# Patient Record
Sex: Male | Born: 1940 | Race: Black or African American | Hispanic: No | Smoking: Current every day smoker
Health system: Southern US, Community
[De-identification: ages and names within clinical notes are randomized; demographics above are authoritative.]

## PROBLEM LIST (undated history)

## (undated) DIAGNOSIS — E785 Hyperlipidemia, unspecified: Secondary | ICD-10-CM

## (undated) DIAGNOSIS — E119 Type 2 diabetes mellitus without complications: Secondary | ICD-10-CM

## (undated) DIAGNOSIS — R1310 Dysphagia, unspecified: Secondary | ICD-10-CM

## (undated) DIAGNOSIS — N400 Enlarged prostate without lower urinary tract symptoms: Secondary | ICD-10-CM

## (undated) DIAGNOSIS — I10 Essential (primary) hypertension: Secondary | ICD-10-CM

## (undated) DIAGNOSIS — I639 Cerebral infarction, unspecified: Secondary | ICD-10-CM

---

## 1999-01-08 ENCOUNTER — Encounter: Payer: Self-pay | Admitting: Emergency Medicine

## 1999-01-08 ENCOUNTER — Observation Stay (HOSPITAL_COMMUNITY): Admission: EM | Admit: 1999-01-08 | Discharge: 1999-01-09 | Payer: Self-pay | Admitting: Emergency Medicine

## 1999-01-13 ENCOUNTER — Ambulatory Visit (HOSPITAL_COMMUNITY): Admission: RE | Admit: 1999-01-13 | Discharge: 1999-01-13 | Payer: Self-pay | Admitting: Internal Medicine

## 2017-04-08 ENCOUNTER — Emergency Department (HOSPITAL_COMMUNITY)
Admission: EM | Admit: 2017-04-08 | Discharge: 2017-04-08 | Disposition: A | Discharge status: Home / Self Care | Payer: Medicare Other | Attending: Emergency Medicine | Admitting: Emergency Medicine

## 2017-04-08 ENCOUNTER — Emergency Department (HOSPITAL_COMMUNITY): Payer: Medicare Other

## 2017-04-08 ENCOUNTER — Encounter (HOSPITAL_COMMUNITY): Payer: Self-pay | Admitting: Emergency Medicine

## 2017-04-08 ENCOUNTER — Other Ambulatory Visit: Payer: Self-pay

## 2017-04-08 DIAGNOSIS — R079 Chest pain, unspecified: Secondary | ICD-10-CM | POA: Insufficient documentation

## 2017-04-08 DIAGNOSIS — E119 Type 2 diabetes mellitus without complications: Secondary | ICD-10-CM | POA: Diagnosis not present

## 2017-04-08 DIAGNOSIS — Z79899 Other long term (current) drug therapy: Secondary | ICD-10-CM | POA: Diagnosis not present

## 2017-04-08 DIAGNOSIS — I10 Essential (primary) hypertension: Secondary | ICD-10-CM | POA: Insufficient documentation

## 2017-04-08 HISTORY — DX: Type 2 diabetes mellitus without complications: E11.9

## 2017-04-08 HISTORY — DX: Essential (primary) hypertension: I10

## 2017-04-08 HISTORY — DX: Cerebral infarction, unspecified: I63.9

## 2017-04-08 HISTORY — DX: Benign prostatic hyperplasia without lower urinary tract symptoms: N40.0

## 2017-04-08 HISTORY — DX: Hyperlipidemia, unspecified: E78.5

## 2017-04-08 HISTORY — DX: Dysphagia, unspecified: R13.10

## 2017-04-08 LAB — CBC
HCT: 44.5 % (ref 39.0–52.0)
HEMOGLOBIN: 14.9 g/dL (ref 13.0–17.0)
MCH: 31.2 pg (ref 26.0–34.0)
MCHC: 33.5 g/dL (ref 30.0–36.0)
MCV: 93.3 fL (ref 78.0–100.0)
Platelets: 216 10*3/uL (ref 150–400)
RBC: 4.77 MIL/uL (ref 4.22–5.81)
RDW: 15 % (ref 11.5–15.5)
WBC: 7 10*3/uL (ref 4.0–10.5)

## 2017-04-08 LAB — BASIC METABOLIC PANEL
ANION GAP: 10 (ref 5–15)
BUN: 12 mg/dL (ref 6–20)
CALCIUM: 8.9 mg/dL (ref 8.9–10.3)
CHLORIDE: 105 mmol/L (ref 101–111)
CO2: 21 mmol/L — AB (ref 22–32)
Creatinine, Ser: 1.2 mg/dL (ref 0.61–1.24)
GFR calc non Af Amer: 57 mL/min — ABNORMAL LOW (ref >60)
Glucose, Bld: 120 mg/dL — ABNORMAL HIGH (ref 65–99)
Potassium: 6.1 mmol/L — ABNORMAL HIGH (ref 3.5–5.1)
Sodium: 136 mmol/L (ref 135–145)

## 2017-04-08 LAB — I-STAT TROPONIN, ED: TROPONIN I, POC: 0.01 ng/mL (ref 0.00–0.08)

## 2017-04-08 LAB — POTASSIUM: Potassium: 3.2 mmol/L — ABNORMAL LOW (ref 3.5–5.1)

## 2017-04-08 LAB — TROPONIN I: Troponin I: <0.03 ng/mL (ref <0.03)

## 2017-04-08 NOTE — ED Triage Notes (Signed)
Pt arrive [facility] c/o CP on waking this am, denies SOB, n/v, pt unable to describe CP.  EMS reports giving 324 ASA and 0.4 SL NTG x 2 with relief.  Pt denies CP on arrival, resp e/u on RA at this time. Dr. Charm BargesButler at bedside.

## 2017-04-08 NOTE — ED Notes (Signed)
CALLED [facility] FOR PT TRANSPORT TO [facility]

## 2017-04-08 NOTE — Discharge Instructions (Signed)
You were seen in the emergency department for chest pain.  Your EKG and troponins did not show any sign of any heart damage.  You should continue your regular medicines and return if any recurrence of symptoms.  You should follow-up with your regular doctor.

## 2017-04-08 NOTE — ED Notes (Signed)
Pt ambulated well to the bathroom and back

## 2017-04-08 NOTE — ED Notes (Signed)
[facility], no response.

## 2017-04-08 NOTE — ED Provider Notes (Signed)
[facility] EMERGENCY DEPARTMENT Provider Note   CSN: [id number] Arrival date & time: 04/08/17  1029     History   Chief Complaint No chief complaint on file.   HPI Oscar Salazar is a 77 y.o. male.  The history is provided by the patient and the EMS personnel.  Chest Pain   This is a new problem. The current episode started 3 to 5 hours ago. The problem has been resolved. The pain is present in the substernal region. The pain is at a severity of 8/10. The quality of the pain is described as sharp. The pain does not radiate. Associated symptoms include shortness of breath. Pertinent negatives include no abdominal pain, no back pain, no cough, no diaphoresis, no fever, no nausea and no vomiting.    Level 5 caveat secondary to dementia.  Patient lives in a memory care facility.  He states he woke up at 630 this morning with sharp substernal chest pain.  He denies having this before.  EMS was called and he was given 324 of aspirin and 2 nitro with resolution of his pain.  Patient is limited in his history and just states it hurts and now it does not hurt anymore.  He states he has a little bit of shortness of breath.  He denies nausea vomiting or diarrhea.  Past Medical History:  Diagnosis Date  . Benign prostate hyperplasia   . Diabetes mellitus without complication (HCC)   . Dysphagia   . Hyperlipemia   . Hypertension   . Stroke [facility])     There are no active problems to display for this patient.   History reviewed. No pertinent surgical history.     Home Medications    Prior to Admission medications   Medication Sig Start Date End Date Taking? Authorizing Provider  acyclovir (ZOVIRAX) 400 MG tablet Take 400 mg by mouth 2 (two) times daily. 04/01/17  Yes [provider]  allopurinol (ZYLOPRIM) 100 MG tablet Take 100 mg by mouth daily. 04/01/17  Yes [provider]  atorvastatin (LIPITOR) 40 MG tablet Take 40 mg by mouth at bedtime. 04/01/17   Yes [provider]  clopidogrel (PLAVIX) 75 MG tablet Take 75 mg by mouth daily. 04/01/17  Yes [provider]  ELIQUIS 5 MG TABS tablet Take 5 mg by mouth 2 (two) times daily. 04/01/17  Yes [provider]  furosemide (LASIX) 40 MG tablet Take 40 mg by mouth daily. 04/01/17  Yes [provider]  Insulin Glargine (BASAGLAR KWIKPEN) 100 UNIT/ML SOPN Inject 14 Units into the skin daily.   Yes [provider]  lisinopril (PRINIVIL,ZESTRIL) 2.5 MG tablet Take 2.5 mg by mouth daily. 04/01/17  Yes [provider]  LYRICA 25 MG capsule Take 25 mg by mouth 2 (two) times daily. 04/01/17  Yes [provider]  metFORMIN (GLUCOPHAGE) 500 MG tablet Take 250 mg by mouth daily. 04/02/17  Yes [provider]  metoprolol tartrate (LOPRESSOR) 25 MG tablet Take 25 mg by mouth 2 (two) times daily. 04/01/17  Yes [provider]  Multiple Vitamin (MULTIVITAMIN) capsule Take 1 capsule by mouth daily.   Yes [provider]  omega-3 acid ethyl esters (LOVAZA) 1 g capsule Take 1 g by mouth 2 (two) times daily. 04/05/17  Yes [provider]  PRESCRIPTION MEDICATION Apply 1 application topically 2 (two) times daily. Mineral Cream Apply to Bilateral legs/feet   Yes [provider]  tamsulosin (FLOMAX) 0.4 MG CAPS capsule Take  0.4 mg by mouth daily. 04/01/17  Yes [provider]    Family History No family history on file.  Social History Social History   Tobacco Use  . Smoking status: Not on file  Substance Use Topics  . Alcohol use: Not on file  . Drug use: Not on file     Allergies   Patient has no allergy information on record.   Review of Systems Review of Systems  Unable to perform ROS: Dementia  Constitutional: Negative for diaphoresis and fever.  HENT: Negative for sore throat.   Respiratory: Positive for shortness of breath. Negative for cough.   Cardiovascular: Positive for chest pain.    Gastrointestinal: Negative for abdominal pain, nausea and vomiting.  Genitourinary: Negative for dysuria.  Musculoskeletal: Negative for back pain.  Skin: Negative for rash.     Physical Exam Updated Vital Signs BP 105/74   Pulse (!) 59   Temp 97.9 F (36.6 C) (Oral)   Resp 14   Ht 5\' 9"  (1.753 m)   Wt 108.9 kg (240 lb)   SpO2 100%   BMI 35.44 kg/m   Physical Exam  Constitutional: He appears well-developed and well-nourished.  HENT:  Head: Normocephalic and atraumatic.  Eyes: Conjunctivae are normal.  Neck: Neck supple.  Cardiovascular: Normal rate and regular rhythm.  No murmur heard. Pulmonary/Chest: Effort normal and breath sounds normal. No respiratory distress.  Abdominal: Soft. There is no tenderness.  Musculoskeletal: He exhibits no edema.       Right lower leg: Normal. He exhibits no tenderness and no edema.       Left lower leg: Normal. He exhibits no tenderness and no edema.  Neurological: He is alert.  Skin: Skin is warm and dry.  Psychiatric: He has a normal mood and affect.  Nursing note and vitals reviewed.    ED Treatments / Results  Labs (all labs ordered are listed, but only abnormal results are displayed) Labs Reviewed  BASIC METABOLIC PANEL - Abnormal; Notable for the following components:      Result Value   Potassium 6.1 (*)    CO2 21 (*)    Glucose, Bld 120 (*)    GFR calc non Af Amer 57 (*)    All other components within normal limits  POTASSIUM - Abnormal; Notable for the following components:   Potassium 3.2 (*)    All other components within normal limits  CBC  TROPONIN I  I-STAT TROPONIN, ED    EKG  EKG Interpretation  Date/Time:  Monday April 08 2017 10:38:52 EST Ventricular Rate:  77 PR Interval:    QRS Duration: 94 QT Interval:  409 QTC Calculation: 463 R Axis:   54 Text Interpretation:  Sinus rhythm Multiform ventricular premature complexes Borderline prolonged PR interval Anterolateral infarct, age indeterminate  t wave inversions ant/lat/inferior no prior ecg to compare with.  Confirmed by Meridee ScoreButler, Michael [phone]([id number]) on 04/08/2017 10:43:32 AM       Radiology Dg Chest Port 1 View  Result Date: 04/08/2017 CLINICAL DATA:  77 year old male with chest pain. Initial encounter. EXAM: PORTABLE CHEST 1 VIEW COMPARISON:  None. FINDINGS: Elevated left hemidiaphragm with left base atelectasis, infiltrate and/or pleural effusion. Mild pulmonary vascular prominence. Cardiomegaly. Calcified slightly tortuous aorta. Buckling of the upper trachea may be related to positioning. Cannot exclude substernal extension of goiter. IMPRESSION: Elevated left hemidiaphragm with left base atelectasis, infiltrate and/or pleural effusion. Mild pulmonary vascular prominence. Cardiomegaly. Aortic Atherosclerosis (ICD10-I70.0).  Aorta slightly tortuous. Buckling of the upper  trachea may be related to positioning. Cannot exclude substernal extension of goiter. Patient would eventually benefit from follow-up two-view chest with cardiac leads removed. Electronically Signed   By: Lacy Duverney M.D.   On: 04/08/2017 10:57    Procedures Procedures (including critical care time)  Medications Ordered in ED Medications - No data to display   Initial Impression / Assessment and Plan / ED Course  I have reviewed the triage vital signs and the nursing notes.  Pertinent labs & imaging results that were available during my care of the patient were reviewed by me and considered in my medical decision making (see chart for details).  Clinical Course as of Apr 09 1101  Mon Apr 08, 2017  1529 Repeat potassium is improved, the initial one was likely hemolyzed.  Waiting on second troponin.  [MB]  1540 Reevaluated patient.  No chest pain.  Second troponin is negative.  We will return him to his facility to follow-up with his primary care doctor.  [MB]    Clinical Course User Index [MB] Terrilee Files, MD      Final Clinical Impressions(s) / ED  Diagnoses   Final diagnoses:  Chest pain in adult    ED Discharge Orders    None       Terrilee Files, MD 04/09/17 1103

## 2018-01-24 ENCOUNTER — Emergency Department (HOSPITAL_COMMUNITY): Payer: Medicare Other

## 2018-01-24 ENCOUNTER — Encounter (HOSPITAL_COMMUNITY): Payer: Self-pay | Admitting: *Deleted

## 2018-01-24 ENCOUNTER — Inpatient Hospital Stay (HOSPITAL_COMMUNITY): Payer: Medicare Other

## 2018-01-24 ENCOUNTER — Other Ambulatory Visit: Payer: Self-pay

## 2018-01-24 ENCOUNTER — Inpatient Hospital Stay (HOSPITAL_COMMUNITY)
Admission: EM | Admit: 2018-01-24 | Discharge: 2018-02-19 | DRG: 064 | Disposition: E | Payer: Medicare Other | Attending: Student in an Organized Health Care Education/Training Program | Admitting: Student in an Organized Health Care Education/Training Program

## 2018-01-24 DIAGNOSIS — Z515 Encounter for palliative care: Secondary | ICD-10-CM | POA: Diagnosis not present

## 2018-01-24 DIAGNOSIS — I5023 Acute on chronic systolic (congestive) heart failure: Secondary | ICD-10-CM | POA: Diagnosis present

## 2018-01-24 DIAGNOSIS — R402132 Coma scale, eyes open, to sound, at arrival to emergency department: Secondary | ICD-10-CM | POA: Diagnosis present

## 2018-01-24 DIAGNOSIS — G92 Toxic encephalopathy: Secondary | ICD-10-CM | POA: Diagnosis present

## 2018-01-24 DIAGNOSIS — R740 Nonspecific elevation of levels of transaminase and lactic acid dehydrogenase [LDH]: Secondary | ICD-10-CM | POA: Diagnosis not present

## 2018-01-24 DIAGNOSIS — J449 Chronic obstructive pulmonary disease, unspecified: Secondary | ICD-10-CM | POA: Diagnosis present

## 2018-01-24 DIAGNOSIS — M795 Residual foreign body in soft tissue: Secondary | ICD-10-CM

## 2018-01-24 DIAGNOSIS — R1013 Epigastric pain: Secondary | ICD-10-CM | POA: Diagnosis not present

## 2018-01-24 DIAGNOSIS — I5021 Acute systolic (congestive) heart failure: Secondary | ICD-10-CM | POA: Diagnosis not present

## 2018-01-24 DIAGNOSIS — R29712 NIHSS score 12: Secondary | ICD-10-CM | POA: Diagnosis present

## 2018-01-24 DIAGNOSIS — R4189 Other symptoms and signs involving cognitive functions and awareness: Secondary | ICD-10-CM

## 2018-01-24 DIAGNOSIS — I493 Ventricular premature depolarization: Secondary | ICD-10-CM | POA: Diagnosis not present

## 2018-01-24 DIAGNOSIS — K7200 Acute and subacute hepatic failure without coma: Secondary | ICD-10-CM | POA: Diagnosis not present

## 2018-01-24 DIAGNOSIS — I4891 Unspecified atrial fibrillation: Secondary | ICD-10-CM | POA: Diagnosis present

## 2018-01-24 DIAGNOSIS — R402342 Coma scale, best motor response, flexion withdrawal, at arrival to emergency department: Secondary | ICD-10-CM | POA: Diagnosis present

## 2018-01-24 DIAGNOSIS — Z7902 Long term (current) use of antithrombotics/antiplatelets: Secondary | ICD-10-CM

## 2018-01-24 DIAGNOSIS — E785 Hyperlipidemia, unspecified: Secondary | ICD-10-CM | POA: Diagnosis present

## 2018-01-24 DIAGNOSIS — N189 Chronic kidney disease, unspecified: Secondary | ICD-10-CM | POA: Diagnosis present

## 2018-01-24 DIAGNOSIS — I240 Acute coronary thrombosis not resulting in myocardial infarction: Secondary | ICD-10-CM | POA: Diagnosis present

## 2018-01-24 DIAGNOSIS — F17210 Nicotine dependence, cigarettes, uncomplicated: Secondary | ICD-10-CM | POA: Diagnosis present

## 2018-01-24 DIAGNOSIS — I69351 Hemiplegia and hemiparesis following cerebral infarction affecting right dominant side: Secondary | ICD-10-CM

## 2018-01-24 DIAGNOSIS — I340 Nonrheumatic mitral (valve) insufficiency: Secondary | ICD-10-CM | POA: Diagnosis not present

## 2018-01-24 DIAGNOSIS — F0150 Vascular dementia without behavioral disturbance: Secondary | ICD-10-CM | POA: Diagnosis present

## 2018-01-24 DIAGNOSIS — F0390 Unspecified dementia without behavioral disturbance: Secondary | ICD-10-CM | POA: Diagnosis not present

## 2018-01-24 DIAGNOSIS — N400 Enlarged prostate without lower urinary tract symptoms: Secondary | ICD-10-CM | POA: Diagnosis present

## 2018-01-24 DIAGNOSIS — L8990 Pressure ulcer of unspecified site, unspecified stage: Secondary | ICD-10-CM | POA: Diagnosis present

## 2018-01-24 DIAGNOSIS — R471 Dysarthria and anarthria: Secondary | ICD-10-CM | POA: Diagnosis present

## 2018-01-24 DIAGNOSIS — R7989 Other specified abnormal findings of blood chemistry: Secondary | ICD-10-CM | POA: Diagnosis not present

## 2018-01-24 DIAGNOSIS — R1310 Dysphagia, unspecified: Secondary | ICD-10-CM | POA: Diagnosis present

## 2018-01-24 DIAGNOSIS — R410 Disorientation, unspecified: Secondary | ICD-10-CM | POA: Diagnosis not present

## 2018-01-24 DIAGNOSIS — R569 Unspecified convulsions: Secondary | ICD-10-CM | POA: Diagnosis not present

## 2018-01-24 DIAGNOSIS — L89152 Pressure ulcer of sacral region, stage 2: Secondary | ICD-10-CM | POA: Diagnosis present

## 2018-01-24 DIAGNOSIS — I236 Thrombosis of atrium, auricular appendage, and ventricle as current complications following acute myocardial infarction: Secondary | ICD-10-CM | POA: Diagnosis not present

## 2018-01-24 DIAGNOSIS — I63512 Cerebral infarction due to unspecified occlusion or stenosis of left middle cerebral artery: Principal | ICD-10-CM | POA: Diagnosis present

## 2018-01-24 DIAGNOSIS — E1122 Type 2 diabetes mellitus with diabetic chronic kidney disease: Secondary | ICD-10-CM | POA: Diagnosis present

## 2018-01-24 DIAGNOSIS — I370 Nonrheumatic pulmonary valve stenosis: Secondary | ICD-10-CM | POA: Diagnosis not present

## 2018-01-24 DIAGNOSIS — G9340 Encephalopathy, unspecified: Secondary | ICD-10-CM | POA: Diagnosis present

## 2018-01-24 DIAGNOSIS — N179 Acute kidney failure, unspecified: Secondary | ICD-10-CM | POA: Diagnosis present

## 2018-01-24 DIAGNOSIS — R402222 Coma scale, best verbal response, incomprehensible words, at arrival to emergency department: Secondary | ICD-10-CM | POA: Diagnosis present

## 2018-01-24 DIAGNOSIS — F0280 Dementia in other diseases classified elsewhere without behavioral disturbance: Secondary | ICD-10-CM | POA: Diagnosis not present

## 2018-01-24 DIAGNOSIS — I130 Hypertensive heart and chronic kidney disease with heart failure and stage 1 through stage 4 chronic kidney disease, or unspecified chronic kidney disease: Secondary | ICD-10-CM | POA: Diagnosis present

## 2018-01-24 DIAGNOSIS — Z66 Do not resuscitate: Secondary | ICD-10-CM | POA: Diagnosis not present

## 2018-01-24 DIAGNOSIS — I471 Supraventricular tachycardia: Secondary | ICD-10-CM | POA: Diagnosis not present

## 2018-01-24 DIAGNOSIS — Z9981 Dependence on supplemental oxygen: Secondary | ICD-10-CM | POA: Diagnosis not present

## 2018-01-24 DIAGNOSIS — R570 Cardiogenic shock: Secondary | ICD-10-CM | POA: Diagnosis not present

## 2018-01-24 DIAGNOSIS — N19 Unspecified kidney failure: Secondary | ICD-10-CM | POA: Diagnosis not present

## 2018-01-24 DIAGNOSIS — R2990 Unspecified symptoms and signs involving the nervous system: Secondary | ICD-10-CM

## 2018-01-24 DIAGNOSIS — E872 Acidosis: Secondary | ICD-10-CM | POA: Diagnosis not present

## 2018-01-24 DIAGNOSIS — R5381 Other malaise: Secondary | ICD-10-CM | POA: Diagnosis not present

## 2018-01-24 DIAGNOSIS — R7401 Elevation of levels of liver transaminase levels: Secondary | ICD-10-CM

## 2018-01-24 DIAGNOSIS — R0682 Tachypnea, not elsewhere classified: Secondary | ICD-10-CM | POA: Diagnosis not present

## 2018-01-24 DIAGNOSIS — Z8673 Personal history of transient ischemic attack (TIA), and cerebral infarction without residual deficits: Secondary | ICD-10-CM | POA: Diagnosis not present

## 2018-01-24 DIAGNOSIS — Z7984 Long term (current) use of oral hypoglycemic drugs: Secondary | ICD-10-CM

## 2018-01-24 DIAGNOSIS — K7290 Hepatic failure, unspecified without coma: Secondary | ICD-10-CM | POA: Diagnosis not present

## 2018-01-24 DIAGNOSIS — I69391 Dysphagia following cerebral infarction: Secondary | ICD-10-CM

## 2018-01-24 LAB — URINALYSIS, ROUTINE W REFLEX MICROSCOPIC
BILIRUBIN URINE: NEGATIVE
Glucose, UA: NEGATIVE mg/dL
HGB URINE DIPSTICK: NEGATIVE
Ketones, ur: NEGATIVE mg/dL
Leukocytes, UA: NEGATIVE
Nitrite: NEGATIVE
Protein, ur: NEGATIVE mg/dL
Specific Gravity, Urine: 1.041 — ABNORMAL HIGH (ref 1.005–1.030)
pH: 5 (ref 5.0–8.0)

## 2018-01-24 LAB — I-STAT CHEM 8, ED
BUN: 53 mg/dL — ABNORMAL HIGH (ref 8–23)
CHLORIDE: 108 mmol/L (ref 98–111)
Calcium, Ion: 1 mmol/L — ABNORMAL LOW (ref 1.15–1.40)
Creatinine, Ser: 1.5 mg/dL — ABNORMAL HIGH (ref 0.61–1.24)
Glucose, Bld: 146 mg/dL — ABNORMAL HIGH (ref 70–99)
HCT: 49 % (ref 39.0–52.0)
Hemoglobin: 16.7 g/dL (ref 13.0–17.0)
POTASSIUM: 5.1 mmol/L (ref 3.5–5.1)
Sodium: 139 mmol/L (ref 135–145)
TCO2: 22 mmol/L (ref 22–32)

## 2018-01-24 LAB — DIFFERENTIAL
Abs Immature Granulocytes: 0.04 10*3/uL (ref 0.00–0.07)
Basophils Absolute: 0 10*3/uL (ref 0.0–0.1)
Basophils Relative: 0 %
EOS PCT: 1 %
Eosinophils Absolute: 0.1 10*3/uL (ref 0.0–0.5)
IMMATURE GRANULOCYTES: 1 %
Lymphocytes Relative: 22 %
Lymphs Abs: 1.9 10*3/uL (ref 0.7–4.0)
Monocytes Absolute: 1.3 10*3/uL — ABNORMAL HIGH (ref 0.1–1.0)
Monocytes Relative: 16 %
Neutro Abs: 5.1 10*3/uL (ref 1.7–7.7)
Neutrophils Relative %: 60 %

## 2018-01-24 LAB — CBC
HCT: 43.2 % (ref 39.0–52.0)
Hemoglobin: 14 g/dL (ref 13.0–17.0)
MCH: 30.4 pg (ref 26.0–34.0)
MCHC: 32.4 g/dL (ref 30.0–36.0)
MCV: 93.7 fL (ref 80.0–100.0)
Platelets: 155 10*3/uL (ref 150–400)
RBC: 4.61 MIL/uL (ref 4.22–5.81)
RDW: 17 % — ABNORMAL HIGH (ref 11.5–15.5)
WBC: 8.3 10*3/uL (ref 4.0–10.5)
nRBC: 0.2 % (ref 0.0–0.2)

## 2018-01-24 LAB — COMPREHENSIVE METABOLIC PANEL
ALT: 429 U/L — ABNORMAL HIGH (ref 0–44)
AST: 259 U/L — ABNORMAL HIGH (ref 15–41)
Albumin: 2.8 g/dL — ABNORMAL LOW (ref 3.5–5.0)
Alkaline Phosphatase: 123 U/L (ref 38–126)
Anion gap: 16 — ABNORMAL HIGH (ref 5–15)
BUN: 45 mg/dL — ABNORMAL HIGH (ref 8–23)
CALCIUM: 8.6 mg/dL — AB (ref 8.9–10.3)
CO2: 18 mmol/L — AB (ref 22–32)
Chloride: 103 mmol/L (ref 98–111)
Creatinine, Ser: 1.97 mg/dL — ABNORMAL HIGH (ref 0.61–1.24)
GFR calc Af Amer: 37 mL/min — ABNORMAL LOW (ref >60)
GFR calc non Af Amer: 32 mL/min — ABNORMAL LOW (ref >60)
Glucose, Bld: 152 mg/dL — ABNORMAL HIGH (ref 70–99)
Potassium: 4.7 mmol/L (ref 3.5–5.1)
Sodium: 137 mmol/L (ref 135–145)
Total Bilirubin: 2.2 mg/dL — ABNORMAL HIGH (ref 0.3–1.2)
Total Protein: 5.8 g/dL — ABNORMAL LOW (ref 6.5–8.1)

## 2018-01-24 LAB — I-STAT TROPONIN, ED: Troponin i, poc: 0.01 ng/mL (ref 0.00–0.08)

## 2018-01-24 LAB — POCT I-STAT 3, ART BLOOD GAS (G3+)
Acid-base deficit: 5 mmol/L — ABNORMAL HIGH (ref 0.0–2.0)
Bicarbonate: 16 mmol/L — ABNORMAL LOW (ref 20.0–28.0)
O2 Saturation: 99 %
TCO2: 17 mmol/L — ABNORMAL LOW (ref 22–32)
pCO2 arterial: 20.8 mmHg — ABNORMAL LOW (ref 32.0–48.0)
pH, Arterial: 7.493 — ABNORMAL HIGH (ref 7.350–7.450)
pO2, Arterial: 146 mmHg — ABNORMAL HIGH (ref 83.0–108.0)

## 2018-01-24 LAB — PROTIME-INR
INR: 2.1
Prothrombin Time: 23.3 seconds — ABNORMAL HIGH (ref 11.4–15.2)

## 2018-01-24 LAB — APTT: aPTT: 37 seconds — ABNORMAL HIGH (ref 24–36)

## 2018-01-24 LAB — ACETAMINOPHEN LEVEL: Acetaminophen (Tylenol), Serum: <10 ug/mL — ABNORMAL LOW (ref 10–30)

## 2018-01-24 LAB — ETHANOL

## 2018-01-24 LAB — MRSA PCR SCREENING: MRSA by PCR: NEGATIVE

## 2018-01-24 LAB — CBG MONITORING, ED: Glucose-Capillary: 139 mg/dL — ABNORMAL HIGH (ref 70–99)

## 2018-01-24 LAB — AMMONIA: Ammonia: 25 umol/L (ref 9–35)

## 2018-01-24 MED ORDER — IOPAMIDOL (ISOVUE-370) INJECTION 76%
75.0000 mL | Freq: Once | INTRAVENOUS | Status: AC | PRN
  Administered 2018-01-24: 75 mL via INTRAVENOUS

## 2018-01-24 MED ORDER — DEXTROSE-NACL 5-0.9 % IV SOLN
INTRAVENOUS | Status: DC
  Administered 2018-01-25 – 2018-01-26 (×3): via INTRAVENOUS

## 2018-01-24 MED ORDER — LORAZEPAM 2 MG/ML IJ SOLN
1.0000 mg | Freq: Once | INTRAMUSCULAR | Status: AC
  Administered 2018-01-25: 1 mg via INTRAVENOUS
  Filled 2018-01-24: qty 1

## 2018-01-24 MED ORDER — HEPARIN SODIUM (PORCINE) 5000 UNIT/ML IJ SOLN
5000.0000 [IU] | Freq: Three times a day (TID) | INTRAMUSCULAR | Status: DC
  Administered 2018-01-24 – 2018-01-25 (×5): 5000 [IU] via SUBCUTANEOUS
  Filled 2018-01-24 (×5): qty 1

## 2018-01-24 MED ORDER — THIAMINE HCL 100 MG/ML IJ SOLN
Freq: Once | INTRAVENOUS | Status: AC
  Administered 2018-01-24: 17:00:00 via INTRAVENOUS
  Filled 2018-01-24: qty 1000

## 2018-01-24 MED ORDER — IOPAMIDOL (ISOVUE-M 300) INJECTION 61%: 15.0000 mL | Freq: Once | INTRAMUSCULAR | Status: DC | PRN

## 2018-01-24 MED ORDER — ENOXAPARIN SODIUM 40 MG/0.4ML ~~LOC~~ SOLN: 40.0000 mg | SUBCUTANEOUS | Status: DC

## 2018-01-24 MED ORDER — DEXTROSE-NACL 5-0.9 % IV SOLN: INTRAVENOUS | Status: DC

## 2018-01-24 NOTE — Code Documentation (Signed)
77[facility] at 1110. [facility] [place] where per staff he was LKW at 0730 when he was administered his medications. He was later found on the toilet with head shaking which appeared seizure-like and his eyes were rolling per staff. Staff could not obtain a pulse or O2 saturation, however, his SBP was in the 100s. Staff report he was alert but would not respond. EMS was called and assessed patient to be altered and activated a code stroke. Of note, patient with a h/o stroke with right sided deficits. Also, patient was on Eliquis until 01/02/2018 when it was discontinued by cardiology due to risks outweighing benefits. Stroke RN called nursing facility where staff report patient with 2 week decline in which patient is now wheelchair bound and was started on oxygen 3 days ago. EMR showing patient with a h/o dementia. Stroke team at the bedside on patient arrival. Patient cleared for CT by Dr. Criss AlvineGoldston. Patient to CT with team. CT followed by CTA completed. NIHSS 12, see documentation for details and code stroke times. Patient was lethargic would awaken to verbal stimulation. Patient able to state his name and age and follow simple commands intermittently. Patient with mild right facial asymetry and right sided weakness, unclear of his degree of baseline weakness. No acute stroke treatment at this time per Dr. Wilford CornerArora. Bedside handoff with ED RN Steward DroneBrenda. Of note, patient with PT 23.3, INR 2.10 and aPTT 37 on arrival.

## 2018-01-24 NOTE — ED Notes (Signed)
Pt did not tolerate MRI.  Dr Rogelia BogaButcher paged.

## 2018-01-24 NOTE — Consult Note (Addendum)
Referring Physician: ER    Reason for Consult: Code Stroke  HPI: Oscar Salazar is an 77 y.o. 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 Today he was normal at breakfast and med pass and was then found on the toilet not responding. Staff report his head was shaking and they called EMS for possible seizure. They also noted his right side weaker then normal so EMS called a code stroke. Upon initial evaluation in the ER, he would arouse and follow brief commands intermittently. Dense, chronic looking right side hemiparesis noted. He was having difficulty breathing, but 02 sats remained stable so [facility]. There is multiple areas of encephalomalacia, but no acute looking infarcts. Given report of possible seizure and diagnostic uncertainty and out of the 3 hr window, no tPA given. No LVO seen.   Date last known well: 01/29/2018 Time last known well: 0745  tPA Given: outside of time window and diagnostic uncertanty  Past Medical History Past Medical History:  Diagnosis Date  . Benign prostate hyperplasia   . Diabetes mellitus without complication (HCC)   . Dysphagia   . Hyperlipemia   . Hypertension   . Stroke [facility](HCC)     Surgical History No past surgical history on file.  Family History  No family history on file.  Social History:   reports that he has been smoking cigarettes. He has been smoking about 0.50 packs per day. He does not have any smokeless tobacco history on file. He reports that he does not drink alcohol or use drugs.  Allergies:  No Known Allergies  Home Medications:   (Not in a hospital admission)  Hospital Medications   ROS: Unable  Physical Examination:  Vitals:   02/09/2018 1100 02/17/2018 1203  BP:  (!) 145/108  Pulse:  84  Resp:  (!) 27   SpO2:  94%  Weight: 86.4 kg     General - well nourished, mod distress Heart - Regular rate and rhythm - no murmer appreciated Lungs - Clear to auscultation; dyspnea noted with accessory muscles.  Abdomen - Soft - non tender; distended Extremities - Distal pulses intact - 2+ pitting edema bilat LEs Skin - Warm and dry; skin tears throughout LEs   Neurologic Examination:  Mental Status: Alerts with repeat stimuli. Gives his name, but cannot answer orientation questions. Speech is sparse, some mild aphasia and mild-mod dysarthria noted. Follows commands with repeated stimuli and effort Cranial Nerves: II: Discs not visualized; Visual fields grossly normal, blink to threat, pupils equal, round, reactive to light III,IV, VI: ptosis not present, extra-ocular motions intact bilaterally V,VII: smile slightly asymmetric on left, facial light touch sensation normal bilaterally VIII: hearing normal bilaterally IX,X: gag reflex present XI: absent on right shoulder shrug XII: midline tongue extension Motor: LUE - 5/5    RUE - 0/5   LLE - 5/5    RLE - 2/5 Tone increased in right; atrophy noted Sensory: Light touch intact throughout, bilaterally Deep Tendon Reflexes: mute LEs, 1+ UE Plantars: mute Cerebellar: Unable to fully test given degree of weakness Gait: deferred at this time.   NIHSS 1a Level of Conscious: 1 1b LOC Questions: 2 1c LOC Commands: 0 2 Best Gaze: 0 3 Visual: 0 4 Facial Palsy: 1 5a Motor Arm - left: 0 5b  Motor Arm - Right: 4 6a Motor Leg - Left: 0 6b Motor Leg - Right: 2 7 Limb Ataxia: 0 8 Sensory: 0 9 Best Language: 1 10 Dysarthria: 1 11 Extinct. and Inattention:0 TOTAL: 12   LABORATORY STUDIES:  Basic Metabolic Panel: Recent Labs  Lab 02-11-2018 1116  NA 139  K 5.1  CL 108  GLUCOSE 146*  BUN 53*  CREATININE 1.50*    Liver Function Tests: No results for input(s): AST, ALT, ALKPHOS, BILITOT, PROT, ALBUMIN in the last 168 hours. No results for  input(s): LIPASE, AMYLASE in the last 168 hours. No results for input(s): AMMONIA in the last 168 hours.  CBC: Recent Labs  Lab Feb 11, 2018 1116  HGB 16.7  HCT 49.0    Cardiac Enzymes: No results for input(s): CKTOTAL, CKMB, CKMBINDEX, TROPONINI in the last 168 hours.  BNP: Invalid input(s): POCBNP  CBG: Recent Labs  Lab Feb 11, 2018 1150  GLUCAP 139*    IMAGING reviewed in CT and directly with radiology: Ct Head Code Stroke Wo Contrast  Result Date: 2018-02-11 CLINICAL DATA:  Code stroke. Right-sided weakness. Possible seizure activity. EXAM: CT HEAD WITHOUT CONTRAST TECHNIQUE: Contiguous axial images were obtained from the base of the skull through the vertex without intravenous contrast. COMPARISON:  01/16/2018 FINDINGS: Brain: A large chronic left MCA territory infarct is again seen as well as chronic right larger than left cerebellar infarcts. There's no evidence of acute large territory infarct, intracranial hemorrhage, mass, midline shift, or extra-axial fluid collection. Periventricular white matter hypodensities are unchanged and nonspecific but compatible with mild chronic small vessel ischemic disease. There is ex vacuo dilatation of the left lateral ventricle. Vascular: Calcified atherosclerosis including chronic calcified plaque or embolus in the proximal left MCA, unchanged. Skull: No acute fracture or suspicious osseous lesion. Sinuses/Orbits: Unremarkable orbits. Paranasal sinuses and mastoid air cells are clear. Other: Scattered foci of venous gas including in the cavernous sinuses, likely iatrogenic. ASPECTS [facility] Stroke Program Early CT Score) Not scored in the setting of a large chronic MCA infarct. IMPRESSION: 1. No evidence of acute intracranial abnormality. 2. Large chronic left MCA infarct. 3. Chronic bilateral cerebellar infarcts. These results were called by telephone at the time of interpretation on 02-11-2018 at 11:45 am to [facility], who verbally  acknowledged these results. Electronically Signed   By: Sebastian Ache M.D.   On: 02/11/2018 11:58   Assessment: 77 y.o. male with previous stroke and significant debility at baseline with right side weakness. Today he was found not responding while on toilet. Ddx: new infarct, seizures as he has risk for this given degree of previous strokes seen on CTH. Per report the pt was on Eliquis, but had been taken off by his cardiologist on 11/14. We do not see Afib in history, but given the large LMCA and bilat cerebellar strokes Afib is highest on ddx. No record of active DVT tx. We are unsure why this was stopped.   Stroke Risk Factors - advanced age, prev stroke, HTN, DM, Afib?, HLD  # Acute neurologic change with AMS- EEG and MRI brain to better evaluate. Could have had worsening of old stroke in setting of other acute process or illness. Ddx: toxic-metabolic, seizures, new stroke. *We were unable to get CTA completed d/t severe dyspnea while in scanner. MRA ordered instead.  # h/o stroke with severe debility- poor neurologic reserve could be contributing as well if other illness # Acute dyspnea w/LE edema- Cheyne-Stokes noted which can also be a toxic-metabolic effect. CXR and  echo oreded # HTN- was actually low upon initial eval.   Plan:  MRI, MRA  of the brain/neck   PT consult, OT consult, Speech consult  Echocardiogram  Telemetry monitoring  Frequent neuro checks  Fall Precautions  DVT prophelaxis  NPO until swallowing evaluation has been passed (aspirin suppository if needed)  Will hold off on further stroke wk up pending results on above plan given some diagnostic uncertainty.     Attending Neurologist's note to follow  Attending addendum I have seen and examined the patient personally. I have personally reviewed the imaging-CT head.  Subjective: Patient was brought in as an acute code stroke from the facility. He has a history of prior left MCA stroke with significant  right-sided deficits and to the best of our history taking his modified Rankin score is 4 at baseline. Last known normal-at breakfast time.  Later on was noted to be unresponsive on the toilet. There was some report of staff noticing his head shaking and he called EMS for possible seizure. He was brought in as an acute code stroke because of inability of EMS to arouse him as well as inability of the patient to follow commands. At the emergency room bridge, he was evaluated as an acute code stroke.  He was belly breathing on arrival. His oxygen saturations were within normal range. Taken in for noncontrast CT of the head emergently.  Review of systems was unable to be obtained due to patient's mentation.  Most of the history taking is from staff at the facility that are not very familiar with him.  Objective: General: Drowsy, no distress. CVS: Regular rate rhythm no murmur appreciated.  Hypotensive. Lungs: Clear to auscultation with some dyspnea noted with the use of accessory muscles as well as abdominal breathing. Abdomen: Soft nondistended nontender Extremities: Pitting edema in both lower extremities and warm and dry skin. Neurological exam Patient is very drowsy, opens eyes with repeated stimulation.  Is able to say his name with dysarthric speech but cannot answer orientation questions. Speech is dysarthric Poor attention concentration Does not name, repeat.  Poor comprehension. Cranial nerve examination: Pupils are equal round reactive to light, extraocular movements intact as evidenced by blink to threat, grossly symmetric smile, tongue midline. Motor exam: flaccid RUE-0/5, some movement in the right lower extremity-2/5, left upper and lower extremity are full strength. Sensory exam-light touch intact bilaterally throughout Plantar reflexes are mute.  Difficult to test coordination.  Imaging-reviewed by me personally-: Noncontrast CT of the head-no evidence of acute issues, large  chronic left MCA infarct, chronic bilateral cerebellar infarcts. CTA head and neck was nondiagnostic due to motion artifact and incorrect bolus timing.  Laboratory results Glucose 146, creatinine 1.97, AST 259, ALT 429  Assesment: 77 year old man with multiple comorbidities including stroke with right hemiparesis in the past coming in for acute onset of altered mental status. He was on Eliquis at some point without any history documented of atrial fibrillation but the Eliquis was discontinued on 01/02/2018 according to the paper records.  Current presentation could be secondary to multiple differential diagnoses: -Toxic metabolic encephalopathy given that his liver functions deranged-do not know baseline. - Seizures due to prior stroke - New stroke   Recommendations: MRI, MRA of the head and neck PT OT Frequent neurochecks Echo Telemetry Check ammonia level We will check A1c and LDL only if imaging is positive for stroke.   We will continue to follow.  -- Milon Dikes, MD [facility] Pager: [phone] If 7pm to  7am, please call on call as listed on AMION.

## 2018-01-24 NOTE — Progress Notes (Signed)
Attempted MRI patient uncooperative.

## 2018-01-24 NOTE — ED Notes (Signed)
Placed pt in hospital bed

## 2018-01-24 NOTE — Progress Notes (Addendum)
ELECTROENCEPHALOGRAM REPORT Date of Study: 01/22/2018 MRN: [id number]  Clinical History:    Oscar Salazar is an 77 y.o. 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 Today he was normal at breakfast and med pass and was then found on the toilet not responding. Staff report his head was shaking and they called EMS for possible seizure. They also noted his right side weaker then normal so EMS called a code stroke. Upon initial evaluation in the ER, he would arouse and follow brief commands intermittently. Dense, chronic looking right side hemiparesis noted. He was having difficulty breathing, but 02 sats remained stable so [facility]. There is multiple areas of encephalomalacia, but no acute looking infarcts. Given report of possible seizure and diagnostic uncertainty and out of the 3 hr window, no tPA given. No LVO seen.  Medications:No AED, no sedation  Technical Summary:  A multichannel digital EEG recording measured by the international 10-20 system with electrodes applied with paste and impedances below 5000 ohms performed in our laboratory with EKG monitoring in an awake and asleep patient. Hyperventilation and photic stimulation were not performed. The digital EEG was referentially recorded, reformatted, and digitally filtered in a variety of bipolar and referential montages for optimal display.  Description:  The patient is awake and asleep during the recording. During maximal wakefulness, there is a symmetric, medium voltage 10 Hz posterior dominant rhythm that attenuates with eye opening. The record is symmetric. During drowsiness and sleep for the most part but showed some focal L slowing at times, there is an increase in theta slowing of the background. Vertex waves and symmetric sleep spindles were seen.  There were no electrographic seizures seen.  EKG lead was  unremarkable.  Impression:  This awake and asleep EEG is abnormal due to L focal slowing and muscle artifact. There were no electrographic seizures in this study.

## 2018-01-24 NOTE — ED Provider Notes (Signed)
[facility] EMERGENCY DEPARTMENT Provider Note   CSN: [id number] Arrival date & time: 01/27/2018  1110   An emergency department physician performed an initial assessment on this suspected stroke patient at 1113([place]).  History   Chief Complaint Chief Complaint  Patient presents with  . Code Stroke    HPI Oscar Salazar is a 77 y.o. male. Level 5 caveat due to dementia. HPI Patient brought in by EMS.  Reportedly was found on the toilet.  Reportedly was unresponsive and shaking.  Reportedly was just shaking head however.  May have had difficulty getting pulse but somewhat difficult to get history from nursing home.  Code stroke had been called.  Seen by neurology and myself.  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  No known seizure history.  Had previously been on Eliquis and recently stopped by cardiology.  It appears that there was a history of atrial fibrillation.  Patient cannot really provide much history to me. Past Medical History:  Diagnosis Date  . Benign prostate hyperplasia   . Diabetes mellitus without complication (HCC)   . Dysphagia   . Hyperlipemia   . Hypertension   . Stroke [facility])     There are no active problems to display for this patient.   History reviewed. No pertinent surgical history.      Home Medications    Prior to Admission medications   Medication Sig Start Date End Date Taking? Authorizing Provider  acyclovir (ZOVIRAX) 400 MG tablet Take 400 mg by mouth 2 (two) times daily. 04/01/17   [provider]  allopurinol (ZYLOPRIM) 100 MG tablet Take 100 mg by mouth daily. 04/01/17   [provider]  atorvastatin (LIPITOR) 40 MG tablet Take 40 mg by mouth at bedtime. 04/01/17   [provider]  clopidogrel (PLAVIX) 75 MG tablet Take 75 mg by mouth daily. 04/01/17   [provider]  ELIQUIS 5 MG TABS tablet Take 5 mg by mouth 2 (two) times daily. 04/01/17   [provider]    furosemide (LASIX) 40 MG tablet Take 40 mg by mouth daily. 04/01/17   [provider]  Insulin Glargine (BASAGLAR KWIKPEN) 100 UNIT/ML SOPN Inject 14 Units into the skin daily.    [provider]  lisinopril (PRINIVIL,ZESTRIL) 2.5 MG tablet Take 2.5 mg by mouth daily. 04/01/17   [provider]  LYRICA 25 MG capsule Take 25 mg by mouth 2 (two) times daily. 04/01/17   [provider]  metFORMIN (GLUCOPHAGE) 500 MG tablet Take 250 mg by mouth daily. 04/02/17   [provider]  metoprolol tartrate (LOPRESSOR) 25 MG tablet Take 25 mg by mouth 2 (two) times daily. 04/01/17   [provider]  Multiple Vitamin (MULTIVITAMIN) capsule Take 1 capsule by mouth daily.    [provider]  omega-3 acid ethyl esters (LOVAZA) 1 g capsule Take 1 g by mouth 2 (two) times daily. 04/05/17   [provider]  PRESCRIPTION MEDICATION Apply 1 application topically 2 (two) times daily. Mineral Cream Apply to Bilateral legs/feet    [provider]  tamsulosin (FLOMAX) 0.4 MG CAPS capsule Take 0.4 mg by mouth daily. 04/01/17   [provider]    Family History No family history on file.  Social History Social History   Tobacco Use  . Smoking status: Current Every Day Smoker    Packs/day: 0.50    Types: Cigarettes  Substance Use Topics  . Alcohol use: No    Frequency: Never  .  Drug use: No     Allergies   Patient has no known allergies.   Review of Systems Review of Systems  Unable to perform ROS: Dementia     Physical Exam Updated Vital Signs BP 101/62   Pulse (!) 57   Temp (!) 97.3 F (36.3 C) (Rectal)   Resp 20   Ht 5\' 9"  (1.753 m)   Wt 86.4 kg   SpO2 100%   BMI 28.13 kg/m   Physical Exam  Constitutional: He appears well-developed.  HENT:  Head: Atraumatic.  Eyes:  Sitting in bed with eyes closed.  Does resist me opening them but appear reactive.  Neck: Neck supple.  Cardiovascular: Normal rate.   Pulmonary/Chest: He has no wheezes. He has no rales.  Abdominal: There is no tenderness.  Musculoskeletal: He exhibits no edema.  Neurological:  Sitting in bed with eyes closed.  Will not speak for me.  For nursing however will raise left side.  Appears to be moving left side greater than right which is likely baseline.  Skin: Skin is warm. Capillary refill takes less than 2 seconds.     ED Treatments / Results  Labs (all labs ordered are listed, but only abnormal results are displayed) Labs Reviewed  PROTIME-INR - Abnormal; Notable for the following components:      Result Value   Prothrombin Time 23.3 (*)    All other components within normal limits  APTT - Abnormal; Notable for the following components:   aPTT 37 (*)    All other components within normal limits  CBC - Abnormal; Notable for the following components:   RDW 17.0 (*)    All other components within normal limits  DIFFERENTIAL - Abnormal; Notable for the following components:   Monocytes Absolute 1.3 (*)    All other components within normal limits  COMPREHENSIVE METABOLIC PANEL - Abnormal; Notable for the following components:   CO2 18 (*)    Glucose, Bld 152 (*)    BUN 45 (*)    Creatinine, Ser 1.97 (*)    Calcium 8.6 (*)    Total Protein 5.8 (*)    Albumin 2.8 (*)    AST 259 (*)    ALT 429 (*)    Total Bilirubin 2.2 (*)    GFR calc non Af Amer 32 (*)    GFR calc Af Amer 37 (*)    Anion gap 16 (*)    All other components within normal limits  CBG MONITORING, ED - Abnormal; Notable for the following components:   Glucose-Capillary 139 (*)    All other components within normal limits  I-STAT CHEM 8, ED - Abnormal; Notable for the following components:   BUN 53 (*)    Creatinine, Ser 1.50 (*)    Glucose, Bld 146 (*)    Calcium, Ion 1.00 (*)    All other components within normal limits  URINALYSIS, ROUTINE W REFLEX MICROSCOPIC  AMMONIA  ACETAMINOPHEN LEVEL  ETHANOL  I-STAT TROPONIN, ED     EKG None  Radiology Ct Angio Head W Or Wo Contrast  Result Date: 02/03/2018 CLINICAL DATA:  Right-sided weakness. EXAM: CT ANGIOGRAPHY HEAD AND NECK TECHNIQUE: Multidetector CT imaging of the head and neck was performed using the standard protocol during bolus administration of intravenous contrast. Multiplanar CT image reconstructions and MIPs were obtained to evaluate the vascular anatomy. Carotid stenosis measurements (when applicable) are obtained utilizing NASCET criteria, using the distal internal carotid diameter as the denominator. CONTRAST:  75mL ISOVUE-370  IOPAMIDOL (ISOVUE-370) INJECTION 76% COMPARISON:  Carotid Doppler ultrasound 05/28/2012 FINDINGS: CTA NECK FINDINGS Due to respiratory motion there was incorrect bolus timing with only venous contrast present including refluxed contrast in the right internal jugular vein and multiple right upper chest venous collaterals. No arterial contrast is present in the head or neck and the study is nondiagnostic for assessment of the arterial structures. Skeleton: Severe diffuse cervical disc degeneration with moderate multilevel neural foraminal stenosis. Other neck: Breathing motion artifact without evidence of acute abnormality or mass. Scattered venous gas, likely iatrogenic. Upper chest: Aortic atherosclerosis. Mild centrilobular emphysema. No apical lung consolidation. Review of the MIP images confirms the above findings CTA HEAD FINDINGS Nondiagnostic evaluation of the intracranial vasculature. Calcified atherosclerosis of the intracranial vertebral and internal carotid arteries with chronic calcified plaque or calcified embolus in the left M1 segment. Review of the MIP images confirms the above findings IMPRESSION: 1. Nondiagnostic study due to motion artifact resulting in incorrect bolus timing. 2. Aortic Atherosclerosis (ICD10-I70.0) and Emphysema (ICD10-J43.9). These results were called by telephone at the time of interpretation on 02/03/2018  at 11:45 pm to Dr. Danise Edge, who verbally acknowledged these results. Electronically Signed   By: Sebastian Ache M.D.   On: 02/16/2018 12:08   Ct Angio Neck W Or Wo Contrast  Result Date: 02/17/2018 CLINICAL DATA:  Right-sided weakness. EXAM: CT ANGIOGRAPHY HEAD AND NECK TECHNIQUE: Multidetector CT imaging of the head and neck was performed using the standard protocol during bolus administration of intravenous contrast. Multiplanar CT image reconstructions and MIPs were obtained to evaluate the vascular anatomy. Carotid stenosis measurements (when applicable) are obtained utilizing NASCET criteria, using the distal internal carotid diameter as the denominator. CONTRAST:  75mL ISOVUE-370 IOPAMIDOL (ISOVUE-370) INJECTION 76% COMPARISON:  Carotid Doppler ultrasound 05/28/2012 FINDINGS: CTA NECK FINDINGS Due to respiratory motion there was incorrect bolus timing with only venous contrast present including refluxed contrast in the right internal jugular vein and multiple right upper chest venous collaterals. No arterial contrast is present in the head or neck and the study is nondiagnostic for assessment of the arterial structures. Skeleton: Severe diffuse cervical disc degeneration with moderate multilevel neural foraminal stenosis. Other neck: Breathing motion artifact without evidence of acute abnormality or mass. Scattered venous gas, likely iatrogenic. Upper chest: Aortic atherosclerosis. Mild centrilobular emphysema. No apical lung consolidation. Review of the MIP images confirms the above findings CTA HEAD FINDINGS Nondiagnostic evaluation of the intracranial vasculature. Calcified atherosclerosis of the intracranial vertebral and internal carotid arteries with chronic calcified plaque or calcified embolus in the left M1 segment. Review of the MIP images confirms the above findings IMPRESSION: 1. Nondiagnostic study due to motion artifact resulting in incorrect bolus timing. 2. Aortic  Atherosclerosis (ICD10-I70.0) and Emphysema (ICD10-J43.9). These results were called by telephone at the time of interpretation on 02/06/2018 at 11:45 pm to Dr. Danise Edge, who verbally acknowledged these results. Electronically Signed   By: Sebastian Ache M.D.   On: 01/31/2018 12:08   Dg Chest Portable 1 View  Result Date: 02/01/2018 CLINICAL DATA:  Acute presentation with altered mental status. EXAM: PORTABLE CHEST 1 VIEW COMPARISON:  12/30/2017 FINDINGS: Chronic cardiomegaly. Venous hypertension without edema. No effusions. No infiltrate, collapse or effusion. Aortic atherosclerosis. No acute bone finding. IMPRESSION: Chronic cardiomegaly and pulmonary venous hypertension. Aortic atherosclerosis. Electronically Signed   By: Paulina Fusi M.D.   On: 02/08/2018 13:19   Ct Head Code Stroke Wo Contrast  Result Date: 01/19/2018 CLINICAL DATA:  Code stroke. Right-sided weakness. Possible  seizure activity. EXAM: CT HEAD WITHOUT CONTRAST TECHNIQUE: Contiguous axial images were obtained from the base of the skull through the vertex without intravenous contrast. COMPARISON:  01/16/2018 FINDINGS: Brain: A large chronic left MCA territory infarct is again seen as well as chronic right larger than left cerebellar infarcts. There's no evidence of acute large territory infarct, intracranial hemorrhage, mass, midline shift, or extra-axial fluid collection. Periventricular white matter hypodensities are unchanged and nonspecific but compatible with mild chronic small vessel ischemic disease. There is ex vacuo dilatation of the left lateral ventricle. Vascular: Calcified atherosclerosis including chronic calcified plaque or embolus in the proximal left MCA, unchanged. Skull: No acute fracture or suspicious osseous lesion. Sinuses/Orbits: Unremarkable orbits. Paranasal sinuses and mastoid air cells are clear. Other: Scattered foci of venous gas including in the cavernous sinuses, likely iatrogenic. ASPECTS [facility]([place]  Stroke Program Early CT Score) Not scored in the setting of a large chronic MCA infarct. IMPRESSION: 1. No evidence of acute intracranial abnormality. 2. Large chronic left MCA infarct. 3. Chronic bilateral cerebellar infarcts. These results were called by telephone at the time of interpretation on 02/16/2018 at 11:45 am to [facility], who verbally acknowledged these results. Electronically Signed   By: Sebastian AcheAllen  Grady M.D.   On: 03-29-17 11:58    Procedures Procedures (including critical care time)  Medications Ordered in ED Medications  iopamidol (ISOVUE-M) 61 % intrathecal injection 15 mL (has no administration in time range)  iopamidol (ISOVUE-370) 76 % injection 75 mL (75 mLs Intravenous Contrast Given 01/22/2018 1146)     Initial Impression / Assessment and Plan / ED Course  I have reviewed the triage vital signs and the nursing notes.  Pertinent labs & imaging results that were available during my care of the patient were reviewed by me and considered in my medical decision making (see chart for details).     Patient with ams. Baseline dementia.  Initially seen as a code stroke but thought to be less likely ischemic.  LFTs were elevated.  Worsening renal function.  X-ray reassuring.  Will admit to unassigned internal medicine for further work-up.  Final Clinical Impressions(s) / ED Diagnoses   Final diagnoses:  Acute encephalopathy  AKI (acute kidney injury) (HCC)  Transaminitis    ED Discharge Orders    None       Benjiman CorePickering, Azell Bill, MD 2017-04-27 1437

## 2018-01-24 NOTE — Progress Notes (Signed)
EEG completed, results pending. 

## 2018-01-24 NOTE — H&P (Signed)
Date: 02/14/2018               Patient Name:  Oscar Salazar MRN: [id number]  DOB: 10/09/1940 Age / Sex: 77 y.o., male   PCP: Shelbie AmmonsHaque, Imran P, MD         Medical Service: Internal Medicine Teaching Service         Attending Physician: Dr. Burns SpainButcher, Elizabeth A, MD    First Contact: Dr. Julian HyJamie [name] MD Pager: [phone]  Second Contact: Dr. 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  Oscar Sal[facility].  History is given by nursing facility staff.  They state that between 3-4 months ago he was able to walk around without assistance.  He was having full conversations with staff and was overall doing well.  He then abruptly began to decline physically and was not able to walk as far stating that he would stop breathing and collapse.  He began working with physical therapy but continued to decline.  In the past 3 to 4 weeks he has only been able to walk about 30 feet before becoming short of breath and collapsing.  The nurse practitioner at the facility did extensive labs that were all unremarkable.  [facility] [place] 1 week ago and was "declining in diagnosis" and did not seem to have an heart issue.  He was then referred to a pulmonologist which he was supposed to see in 1 to 2 weeks.  This morning he was communicating well at around 8 AM.  He then went to the bathroom where he was found to have jerking movements and labored breathing.  They were unable to get an oxygen reading and subsequently hold  EMS.  Meds:  Current Meds  Medication Sig  . acyclovir (ZOVIRAX) 400 MG tablet Take 400 mg by mouth 2 (two) times daily.  Marland Kitchen. allopurinol (ZYLOPRIM) 100 MG tablet Take 100 mg by mouth daily.  Marland Kitchen. atorvastatin (LIPITOR) 40 MG tablet Take 40 mg by mouth at bedtime.  . clopidogrel (PLAVIX) 75 MG tablet Take 75 mg by mouth daily.  . furosemide (LASIX) 20 MG tablet Take 20 mg by mouth daily.   Marland Kitchen. loratadine (CLARITIN) 10 MG tablet Take 10 mg by mouth daily.  . metFORMIN (GLUCOPHAGE) 500 MG tablet Take 250 mg by mouth 2 (two) times daily with a meal.   . metoprolol tartrate (LOPRESSOR) 25 MG tablet Take 25 mg by mouth 2 (two) times daily.  . Multiple Vitamin (MULTIVITAMIN WITH MINERALS) TABS tablet Take 1 tablet by mouth daily.  . OXYGEN Inhale 2 L into the lungs continuous.  . pregabalin (LYRICA) 25 MG capsule Take 25 mg by mouth 2 (two) times daily.  . sertraline (ZOLOFT) 25 MG tablet Take 25 mg by mouth daily. For anxiety  . tamsulosin (FLOMAX) 0.4 MG CAPS capsule Take 0.4 mg by mouth daily.     Allergies: Allergies as of 2017/06/28  . (No Known Allergies)   Past Medical History:  Diagnosis Date  . Benign prostate hyperplasia   . Diabetes mellitus without  complication (HCC)   . Dysphagia   . Hyperlipemia   . Hypertension   . Stroke [facility])     Family History: Unable to elicit family history due to patient's mental status.  Social History: He lives at [facility] skilled nursing facility.  Review of Systems: A complete ROS was negative except as per HPI.   Physical Exam: Blood pressure 101/62, pulse (!) 57, temperature (!) 97.3 F (36.3 C), temperature source Rectal, resp. rate 20, height 5\' 9"  (1.753 m), weight 86.4 kg, SpO2 100 %. Physical Exam  Constitutional: He appears well-developed and well-nourished.  Eyes: Pupils are equal, round, and reactive to light. Conjunctivae and EOM are normal.  Neck: Normal range of motion.  Cardiovascular: Normal rate, regular rhythm and  normal heart sounds.  Pulmonary/Chest:  Wheezes noted in all lung fields. He has very shallow abdominal breathing for 20-30 seconds and will then start to heavily breath for several seconds.  Abdominal: Soft. Bowel sounds are normal. He exhibits no distension. There is no tenderness.  Neurological: He is alert.  He is somnolent and awakes intermittently to answer questions with few word answers.  Skin:  Small stage II pressure ulcers at the coccyx area and perineal area. Another small stage II pressure ulcer at his left shin area of LE.  Nursing note and vitals reviewed.   EKG: personally reviewed my interpretation is sinus rhythm, normal heart rate with prolonged QT  CXR: personally reviewed my interpretation is cardiomegaly without effusions or infiltrates.  CT head: No evidence of acute intracranial abnormality.  Large chronic left MCA infarct.  Chronic bilateral cerebellar infarcts.  CT angios head and neck: Calcified atherosclerosis of the intracranial vertebral and internal carotid arteries with chronic calcified plaque in the left M1 segment.  Unable to do CTA neck due to incorrect bolus timing.  EEG: Impression: This awake and asleep EEG is abnormal due to L focal slowing and muscle artifact. There were no electrographic seizures in this study.  Labs significant for: Elevated PT 23.3, INR 2.10, PTT at 37 Unremarkable CBC Elevated transaminases AST/ALT: 259/429 Elevated T bili at 2.2 Weighted creatinine at 1.97 (1.50 10 months ago) Troponins negative  Assessment & Plan by Problem: Active Problems:   Encephalopathy  Oscar Salazar presents with encephalopathy of unclear etiology.  MRI does not show signs of an acute ischemic event.  LFTs were elevated and he does seem to have worsening renal function.  Differential includes hypercapnia, worsening dementia, ingestion, and metabolic abnormalities.  Encephalopathy of unclear etiology - Continue supplemental oxygen as needed -  D5 half normal saline with thiamine and folic acid maintenance fluids - Follow-up ABG. - Follow-up urinalysis - Follow-up acetaminophen and ethanol levels - Follow-up ammonia - Follow-up echo  Transaminitis - Ordered complete abdominal ultrasound - Follow-up acute hepatitis panel  Acute kidney injury: BUN/Cre=22.84 suggest prerenal causes such as dehydration.  Will order fluids and repeat CMP in the morning. - Continue fluids per above  CODE STATUS-full code DVT PPX: Heparin subcu FEN: NPO  Dispo: Admit patient to Inpatient with expected length of stay greater than 2 midnights.  Signed: Synetta Shadow, MD 02/09/2018, 3:12 PM  Pager: [phone]

## 2018-01-24 NOTE — ED Notes (Signed)
Patient transported to MRI 

## 2018-01-25 ENCOUNTER — Inpatient Hospital Stay (HOSPITAL_COMMUNITY): Payer: Medicare Other

## 2018-01-25 DIAGNOSIS — I370 Nonrheumatic pulmonary valve stenosis: Secondary | ICD-10-CM

## 2018-01-25 DIAGNOSIS — G9340 Encephalopathy, unspecified: Secondary | ICD-10-CM

## 2018-01-25 DIAGNOSIS — R740 Nonspecific elevation of levels of transaminase and lactic acid dehydrogenase [LDH]: Secondary | ICD-10-CM

## 2018-01-25 DIAGNOSIS — R1013 Epigastric pain: Secondary | ICD-10-CM

## 2018-01-25 DIAGNOSIS — F0390 Unspecified dementia without behavioral disturbance: Secondary | ICD-10-CM

## 2018-01-25 DIAGNOSIS — R7989 Other specified abnormal findings of blood chemistry: Secondary | ICD-10-CM

## 2018-01-25 DIAGNOSIS — F0150 Vascular dementia without behavioral disturbance: Secondary | ICD-10-CM

## 2018-01-25 DIAGNOSIS — Z9981 Dependence on supplemental oxygen: Secondary | ICD-10-CM

## 2018-01-25 DIAGNOSIS — Z8673 Personal history of transient ischemic attack (TIA), and cerebral infarction without residual deficits: Secondary | ICD-10-CM

## 2018-01-25 DIAGNOSIS — R5381 Other malaise: Secondary | ICD-10-CM

## 2018-01-25 DIAGNOSIS — I340 Nonrheumatic mitral (valve) insufficiency: Secondary | ICD-10-CM

## 2018-01-25 LAB — COMPREHENSIVE METABOLIC PANEL
ALBUMIN: 3.1 g/dL — AB (ref 3.5–5.0)
ALT: 496 U/L — AB (ref 0–44)
AST: 356 U/L — ABNORMAL HIGH (ref 15–41)
Alkaline Phosphatase: 134 U/L — ABNORMAL HIGH (ref 38–126)
Anion gap: 15 (ref 5–15)
BUN: 50 mg/dL — AB (ref 8–23)
CO2: 19 mmol/L — ABNORMAL LOW (ref 22–32)
CREATININE: 1.82 mg/dL — AB (ref 0.61–1.24)
Calcium: 8.8 mg/dL — ABNORMAL LOW (ref 8.9–10.3)
Chloride: 107 mmol/L (ref 98–111)
GFR calc Af Amer: 41 mL/min — ABNORMAL LOW (ref >60)
GFR calc non Af Amer: 35 mL/min — ABNORMAL LOW (ref >60)
Glucose, Bld: 145 mg/dL — ABNORMAL HIGH (ref 70–99)
Potassium: 4.8 mmol/L (ref 3.5–5.1)
Sodium: 141 mmol/L (ref 135–145)
Total Bilirubin: 2.7 mg/dL — ABNORMAL HIGH (ref 0.3–1.2)
Total Protein: 6.1 g/dL — ABNORMAL LOW (ref 6.5–8.1)

## 2018-01-25 LAB — CBC
HCT: 42.5 % (ref 39.0–52.0)
Hemoglobin: 13.9 g/dL (ref 13.0–17.0)
MCH: 29.5 pg (ref 26.0–34.0)
MCHC: 32.7 g/dL (ref 30.0–36.0)
MCV: 90.2 fL (ref 80.0–100.0)
Platelets: 150 10*3/uL (ref 150–400)
RBC: 4.71 MIL/uL (ref 4.22–5.81)
RDW: 16.8 % — ABNORMAL HIGH (ref 11.5–15.5)
WBC: 8.1 10*3/uL (ref 4.0–10.5)
nRBC: 0.4 % — ABNORMAL HIGH (ref 0.0–0.2)

## 2018-01-25 LAB — HEPATITIS PANEL, ACUTE
HCV Ab: <0.1 s/co ratio (ref 0.0–0.9)
Hep A IgM: NEGATIVE
Hep B C IgM: NEGATIVE
Hepatitis B Surface Ag: NEGATIVE

## 2018-01-25 LAB — ECHOCARDIOGRAM COMPLETE
Height: 69 in
Weight: 3047.64 oz

## 2018-01-25 MED ORDER — ORAL CARE MOUTH RINSE
15.0000 mL | Freq: Two times a day (BID) | OROMUCOSAL | Status: DC
  Administered 2018-01-25: 15 mL via OROMUCOSAL

## 2018-01-25 MED ORDER — DIPHENHYDRAMINE HCL 50 MG/ML IJ SOLN
25.0000 mg | Freq: Once | INTRAMUSCULAR | Status: AC
  Administered 2018-01-25: 25 mg via INTRAVENOUS
  Filled 2018-01-25: qty 1

## 2018-01-25 NOTE — Progress Notes (Signed)
  Echocardiogram 2D Echocardiogram has been performed.  Celene SkeenVijay  Larwence Tu 01/25/2018, 2:55 PM

## 2018-01-25 NOTE — Progress Notes (Addendum)
Neurology Progress Note:  Interval History: Pt admitted from [facility] as a code stroke. MRI neg for new stroke. 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 He has been restless and agitated overnight. LFTs are very elevated and he continues to have dyspnea. Primary team working up hepatic encephalopathy. Neuro exam remains unchanged at this time  ROS: unable d/t agitation/confusion  Allergies No Known Allergies  Home Medications Medications Prior to Admission  Medication Sig Dispense Refill  . acyclovir (ZOVIRAX) 400 MG tablet Take 400 mg by mouth 2 (two) times daily.    Marland Kitchen allopurinol (ZYLOPRIM) 100 MG tablet Take 100 mg by mouth daily.    Marland Kitchen atorvastatin (LIPITOR) 40 MG tablet Take 40 mg by mouth at bedtime.    . clopidogrel (PLAVIX) 75 MG tablet Take 75 mg by mouth daily.    . furosemide (LASIX) 20 MG tablet Take 20 mg by mouth daily.     Marland Kitchen loratadine (CLARITIN) 10 MG tablet Take 10 mg by mouth daily.    . metFORMIN (GLUCOPHAGE) 500 MG tablet Take 250 mg by mouth 2 (two) times daily with a meal.     . metoprolol tartrate (LOPRESSOR) 25 MG tablet Take 25 mg by mouth 2 (two) times daily.    . Multiple Vitamin (MULTIVITAMIN WITH MINERALS) TABS tablet Take 1 tablet by mouth daily.    . OXYGEN Inhale 2 L into the lungs continuous.    . pregabalin (LYRICA) 25 MG capsule Take 25 mg by mouth 2 (two) times daily.    . sertraline (ZOLOFT) 25 MG tablet Take 25 mg by mouth daily. For anxiety    . tamsulosin (FLOMAX) 0.4 MG CAPS capsule Take 0.4 mg by mouth daily.      Hospital Medications . heparin  5,000 Units Subcutaneous Q8H     Objective  Intake/Output from previous day: No intake/output data recorded. Intake/Output this shift: No intake/output data recorded. Nutritional status:  Diet Order            Diet NPO time specified  Diet effective now               Physical Exam -  Vitals:   01/25/2018 1845 02/14/2018 2100 01/25/18 0000 01/25/18 0429  BP:  101/73 100/75 114/78 114/79  Pulse: 91 87 90 97  Resp: (!) 23 (!) 25 (!) 25 (!) 33  Temp: 97.7 F (36.5 C) (!) 97.4 F (36.3 C) (!) 97.4 F (36.3 C) 98.3 F (36.8 C)  TempSrc: Oral Oral Oral Oral  SpO2: 96% 99% 93% 100%  Weight:      Height:       General - moderate distress Heart - Regular rate and rhythm - no murmer Lungs - Clear to auscultation; dyspnea Abdomen - Soft, but distended Extremities - Distal pulses intact - 1-2+LE edema Skin - Warm and dry  Neurologic Exam:   Mental Status: Alerts with repeat stimuli. Gives his name, but cannot answer orientation questions. Speech is sparse, some mild aphasia and mild-mod dysarthria noted. Follows commands with repeated stimuli and effort Cranial Nerves: II: Discs not visualized; Visual fields grossly normal, blink to threat, pupils equal, round, reactive to light III,IV, VI: ptosis not present, extra-ocular motions intact bilaterally V,VII: smile slightly asymmetric on left, facial light touch sensation normal bilaterally VIII: hearing normal bilaterally IX,X: gag reflex present XI: absent on right shoulder shrug XII: midline tongue extension Motor: LUE - 5/5  RUE - 0/5   LLE - 5/5                                             RLE - 2/5 Tone increased in right; atrophy noted Sensory: Light touch intact throughout, bilaterally Deep Tendon Reflexes: mute LEs, 1+ UE Plantars: mute Cerebellar: Unable to fully test given degree of weakness Gait: unable at baseline, uses w/c     LABORATORY RESULTS:  Basic Metabolic Panel: Recent Labs  Lab 01/26/2018 1116 02/09/2018 1149 01/25/18 0709  NA 139 137 141  K 5.1 4.7 4.8  CL 108 103 107  CO2  --  18* 19*  GLUCOSE 146* 152* 145*  BUN 53* 45* 50*  CREATININE 1.50* 1.97* 1.82*  CALCIUM  --  8.6* 8.8*    Liver Function Tests: Recent Labs  Lab 01/25/2018 1149 01/25/18 0709  AST 259* 356*  ALT 429* 496*  ALKPHOS 123 134*  BILITOT  2.2* 2.7*  PROT 5.8* 6.1*  ALBUMIN 2.8* 3.1*   No results for input(s): LIPASE, AMYLASE in the last 168 hours. Recent Labs  Lab 02/13/2018 1703  AMMONIA 25    CBC: Recent Labs  Lab 01/26/2018 1116 01/27/2018 1149 01/25/18 0709  WBC  --  8.3 8.1  NEUTROABS  --  5.1  --   HGB 16.7 14.0 13.9  HCT 49.0 43.2 42.5  MCV  --  93.7 90.2  PLT  --  155 150    Cardiac Enzymes: No results for input(s): CKTOTAL, CKMB, CKMBINDEX, TROPONINI in the last 168 hours.  Lipid Panel: No results for input(s): CHOL, TRIG, HDL, CHOLHDL, VLDL, LDLCALC in the last 168 hours.  CBG: Recent Labs  Lab 01/22/2018 1150  GLUCAP 139*    Microbiology:   Coagulation Studies: Recent Labs    02/05/2018 1149  LABPROT 23.3*  INR 2.10    Miscellaneous Labs:   IMAGING RESULTS Reviewed: Ct Angio Head W Or Wo Contrast  Result Date: 01/23/2018 CLINICAL DATA:  Right-sided weakness. EXAM: CT ANGIOGRAPHY HEAD AND NECK TECHNIQUE: Multidetector CT imaging of the head and neck was performed using the standard protocol during bolus administration of intravenous contrast. Multiplanar CT image reconstructions and MIPs were obtained to evaluate the vascular anatomy. Carotid stenosis measurements (when applicable) are obtained utilizing NASCET criteria, using the distal internal carotid diameter as the denominator. CONTRAST:  75mL ISOVUE-370 IOPAMIDOL (ISOVUE-370) INJECTION 76% COMPARISON:  Carotid Doppler ultrasound 05/28/2012 FINDINGS: CTA NECK FINDINGS Due to respiratory motion there was incorrect bolus timing with only venous contrast present including refluxed contrast in the right internal jugular vein and multiple right upper chest venous collaterals. No arterial contrast is present in the head or neck and the study is nondiagnostic for assessment of the arterial structures. Skeleton: Severe diffuse cervical disc degeneration with moderate multilevel neural foraminal stenosis. Other neck: Breathing motion artifact without  evidence of acute abnormality or mass. Scattered venous gas, likely iatrogenic. Upper chest: Aortic atherosclerosis. Mild centrilobular emphysema. No apical lung consolidation. Review of the MIP images confirms the above findings CTA HEAD FINDINGS Nondiagnostic evaluation of the intracranial vasculature. Calcified atherosclerosis of the intracranial vertebral and internal carotid arteries with chronic calcified plaque or calcified embolus in the left M1 segment. Review of the MIP images confirms the above findings IMPRESSION: 1. Nondiagnostic study due to motion artifact resulting in incorrect bolus timing. 2. Aortic Atherosclerosis (ICD10-I70.0) and  Emphysema (ICD10-J43.9). These results were called by telephone at the time of interpretation on 02/15/2018 at 11:45 pm to Dr. Danise Edge, who verbally acknowledged these results. Electronically Signed   By: Sebastian Ache M.D.   On: 02/18/2018 12:08   Dg Abd 1 View  Result Date: 01/25/2018 CLINICAL DATA:  77 year old male. Evaluate for foreign object prior to MRI scan. EXAM: ABDOMEN - 1 VIEW COMPARISON:  None. FINDINGS: A transverse fixation screws through the bilateral SI joints and sacrum as well as additional left SI joint fixation screw. No other metallic or radiopaque foreign object identified. There is no bowel dilatation or evidence of obstruction. Excreted contrast noted in the renal collecting system and within the bladder. There is degenerative changes of the spine. Partially visualized left pleural effusion. IMPRESSION: Two fixation screws in the sacrum and SI joints. No additional radiopaque foreign object. Electronically Signed   By: Elgie Collard M.D.   On: 01/25/2018 02:48   Ct Angio Neck W Or Wo Contrast  Result Date: 01/20/2018 CLINICAL DATA:  Right-sided weakness. EXAM: CT ANGIOGRAPHY HEAD AND NECK TECHNIQUE: Multidetector CT imaging of the head and neck was performed using the standard protocol during bolus administration of  intravenous contrast. Multiplanar CT image reconstructions and MIPs were obtained to evaluate the vascular anatomy. Carotid stenosis measurements (when applicable) are obtained utilizing NASCET criteria, using the distal internal carotid diameter as the denominator. CONTRAST:  75mL ISOVUE-370 IOPAMIDOL (ISOVUE-370) INJECTION 76% COMPARISON:  Carotid Doppler ultrasound 05/28/2012 FINDINGS: CTA NECK FINDINGS Due to respiratory motion there was incorrect bolus timing with only venous contrast present including refluxed contrast in the right internal jugular vein and multiple right upper chest venous collaterals. No arterial contrast is present in the head or neck and the study is nondiagnostic for assessment of the arterial structures. Skeleton: Severe diffuse cervical disc degeneration with moderate multilevel neural foraminal stenosis. Other neck: Breathing motion artifact without evidence of acute abnormality or mass. Scattered venous gas, likely iatrogenic. Upper chest: Aortic atherosclerosis. Mild centrilobular emphysema. No apical lung consolidation. Review of the MIP images confirms the above findings CTA HEAD FINDINGS Nondiagnostic evaluation of the intracranial vasculature. Calcified atherosclerosis of the intracranial vertebral and internal carotid arteries with chronic calcified plaque or calcified embolus in the left M1 segment. Review of the MIP images confirms the above findings IMPRESSION: 1. Nondiagnostic study due to motion artifact resulting in incorrect bolus timing. 2. Aortic Atherosclerosis (ICD10-I70.0) and Emphysema (ICD10-J43.9). These results were called by telephone at the time of interpretation on 01/20/2018 at 11:45 pm to Dr. Danise Edge, who verbally acknowledged these results. Electronically Signed   By: Sebastian Ache M.D.   On: 02/06/2018 12:08   Mr Maxine Glenn Head Wo Contrast  Result Date: 01/25/2018 CLINICAL DATA:  Initial evaluation for acute encephalopathy, stroke. EXAM: MRI  HEAD WITHOUT CONTRAST MRA HEAD WITHOUT CONTRAST MRA NECK WITHOUT CONTRAST TECHNIQUE: Multiplanar, multiecho pulse sequences of the brain and surrounding structures were obtained without intravenous contrast. Angiographic images of the Circle of Willis were obtained using MRA technique without intravenous contrast. Angiographic images of the neck were obtained using MRA technique without intravenous contrast. Carotid stenosis measurements (when applicable) are obtained utilizing NASCET criteria, using the distal internal carotid diameter as the denominator. COMPARISON:  Prior CTs from 01/25/2018 FINDINGS: MRI HEAD FINDINGS Brain: Examination severely limited due to extensive motion artifact. Advanced parenchymal volume loss with mild to moderate chronic microvascular ischemic disease. Large chronic left MCA territory infarct with associated encephalomalacia and gliosis. Additional chronic bilateral  cerebellar infarcts, right greater than left. No definite foci of restricted diffusion to suggest acute or subacute ischemia. Gray-white matter differentiation otherwise maintained. No definite foci of susceptibility artifact to suggest acute or chronic intracranial hemorrhage. No appreciable mass lesion on this motion degraded exam. No midline shift or mass effect. No hydrocephalus. No appreciable extra-axial fluid collection. Pituitary gland grossly normal. Vascular: Abnormal flow void within the left ICA to approximately the level of the terminus, suggesting slow flow and/or occlusion. Major intracranial vascular flow voids otherwise grossly maintained at the skull base. Skull and upper cervical spine: Craniocervical junction within normal limits. No focal marrow replacing lesion. Scalp soft tissues unremarkable. Sinuses/Orbits: Globes and orbital soft tissues grossly within normal limits. Scattered mucosal thickening throughout the lobe paranasal sinuses. No air-fluid levels identified. Mastoid air cells clear. Other:  None. MRA HEAD FINDINGS ANTERIOR CIRCULATION: Examination severely limited by extensive motion artifact. Absent flow related signal within the left ICA to the terminus, consistent with occlusion. Right ICA grossly patent to the terminus. Evaluation for stenosis limited due to extensive motion. A1 segments patent bilaterally. Hypoplastic left A1. Grossly patent and normal anterior communicating artery. Anterior cerebral arteries grossly patent to their mid aspects. M1 segments patent bilaterally without occlusion or appreciable stenosis. Normal MCA bifurcations. Right MCA branches perfused. Left MCA branches perfused as well though appear attenuated as compared to the right, likely related to chronic left MCA territory infarct. POSTERIOR CIRCULATION: Vertebral arteries grossly patent at the skull base. Posterior inferior cerebral arteries patent proximally. Basilar grossly patent to its distal aspect. No appreciable basilar stenosis on this motion degraded exam. Superior cerebral arteries patent proximally. Both of the PCAs primarily supplied via the basilar, although small bilateral posterior communicating arteries noted. PCAs grossly patent to their distal aspects. Possible short-segment severe right P2 stenosis noted (series 1052, image 15). MRA NECK FINDINGS Examination is essentially nondiagnostic due to extensive motion artifact. Right common and internal carotid arteries grossly patent. Some flow related signal seen within the left CCA, although the left ICA not well seen and may be occluded. Partially visualized vertebral arteries patent with antegrade flow. Right vertebral artery appears dominant. Examination inadequate to evaluate for stenoses. IMPRESSION: MRI HEAD IMPRESSION: 1. Severely limited study due to extensive motion artifact. 2. No definite acute intracranial infarct or other abnormality identified. 3. 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  4. Chronic left ICA occlusion. MRA HEAD IMPRESSION: 1. Severely limited exam due to extensive motion artifact. 2. Chronic left ICA occlusion. Distal reconstitution of the left MCA and ACA via flow across the circle-of-Willis. 3. Remainder of the major arterial vasculature of the head grossly patent without obvious abnormality. MRA NECK IMPRESSION: 1. Essentially nondiagnostic MRA of the neck due to severe motion artifact. 2. Grossly patent right carotid artery system and vertebral arteries. Partially visualized left CCA patent. Left ICA not visualized, and may be occluded within the neck. Electronically Signed   By: Rise Mu M.D.   On: 01/25/2018 05:59   Mr Maxine Glenn Neck Wo Contrast  Result Date: 01/25/2018 CLINICAL DATA:  Initial evaluation for acute encephalopathy, stroke. EXAM: MRI HEAD WITHOUT CONTRAST MRA HEAD WITHOUT CONTRAST MRA NECK WITHOUT CONTRAST TECHNIQUE: Multiplanar, multiecho pulse sequences of the brain and surrounding structures were obtained without intravenous contrast. Angiographic images of the Circle of Willis were obtained using MRA technique without intravenous contrast. Angiographic images of the neck were obtained using MRA technique without intravenous contrast. Carotid stenosis measurements (when applicable) are obtained  utilizing NASCET criteria, using the distal internal carotid diameter as the denominator. COMPARISON:  Prior CTs from January 28, 2018 FINDINGS: MRI HEAD FINDINGS Brain: Examination severely limited due to extensive motion artifact. Advanced parenchymal volume loss with mild to moderate chronic microvascular ischemic disease. Large chronic left MCA territory infarct with associated encephalomalacia and gliosis. Additional chronic bilateral cerebellar infarcts, right greater than left. No definite foci of restricted diffusion to suggest acute or subacute ischemia. Gray-white matter differentiation otherwise maintained. No definite foci of susceptibility artifact to  suggest acute or chronic intracranial hemorrhage. No appreciable mass lesion on this motion degraded exam. No midline shift or mass effect. No hydrocephalus. No appreciable extra-axial fluid collection. Pituitary gland grossly normal. Vascular: Abnormal flow void within the left ICA to approximately the level of the terminus, suggesting slow flow and/or occlusion. Major intracranial vascular flow voids otherwise grossly maintained at the skull base. Skull and upper cervical spine: Craniocervical junction within normal limits. No focal marrow replacing lesion. Scalp soft tissues unremarkable. Sinuses/Orbits: Globes and orbital soft tissues grossly within normal limits. Scattered mucosal thickening throughout the lobe paranasal sinuses. No air-fluid levels identified. Mastoid air cells clear. Other: None. MRA HEAD FINDINGS ANTERIOR CIRCULATION: Examination severely limited by extensive motion artifact. Absent flow related signal within the left ICA to the terminus, consistent with occlusion. Right ICA grossly patent to the terminus. Evaluation for stenosis limited due to extensive motion. A1 segments patent bilaterally. Hypoplastic left A1. Grossly patent and normal anterior communicating artery. Anterior cerebral arteries grossly patent to their mid aspects. M1 segments patent bilaterally without occlusion or appreciable stenosis. Normal MCA bifurcations. Right MCA branches perfused. Left MCA branches perfused as well though appear attenuated as compared to the right, likely related to chronic left MCA territory infarct. POSTERIOR CIRCULATION: Vertebral arteries grossly patent at the skull base. Posterior inferior cerebral arteries patent proximally. Basilar grossly patent to its distal aspect. No appreciable basilar stenosis on this motion degraded exam. Superior cerebral arteries patent proximally. Both of the PCAs primarily supplied via the basilar, although small bilateral posterior communicating arteries noted.  PCAs grossly patent to their distal aspects. Possible short-segment severe right P2 stenosis noted (series 1052, image 15). MRA NECK FINDINGS Examination is essentially nondiagnostic due to extensive motion artifact. Right common and internal carotid arteries grossly patent. Some flow related signal seen within the left CCA, although the left ICA not well seen and may be occluded. Partially visualized vertebral arteries patent with antegrade flow. Right vertebral artery appears dominant. Examination inadequate to evaluate for stenoses. IMPRESSION: MRI HEAD IMPRESSION: 1. Severely limited study due to extensive motion artifact. 2. No definite acute intracranial infarct or other abnormality identified. 3. 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 MRA HEAD IMPRESSION: 1. Severely limited exam due to extensive motion artifact. 2. Chronic left ICA occlusion. Distal reconstitution of the left MCA and ACA via flow across the circle-of-Willis. 3. Remainder of the major arterial vasculature of the head grossly patent without obvious abnormality. MRA NECK IMPRESSION: 1. Essentially nondiagnostic MRA of the neck due to severe motion artifact. 2. Grossly patent right carotid artery system and vertebral arteries. Partially visualized left CCA patent. Left ICA not visualized, and may be occluded within the neck. Electronically Signed   By: Rise Mu M.D.   On: 01/25/2018 05:59   Mr Brain Wo Contrast  Result Date: 01/25/2018 CLINICAL DATA:  Initial evaluation for acute encephalopathy, stroke. EXAM: MRI HEAD WITHOUT CONTRAST MRA HEAD WITHOUT  CONTRAST MRA NECK WITHOUT CONTRAST TECHNIQUE: Multiplanar, multiecho pulse sequences of the brain and surrounding structures were obtained without intravenous contrast. Angiographic images of the Circle of Willis were obtained using MRA technique without intravenous contrast. Angiographic images  of the neck were obtained using MRA technique without intravenous contrast. Carotid stenosis measurements (when applicable) are obtained utilizing NASCET criteria, using the distal internal carotid diameter as the denominator. COMPARISON:  Prior CTs from 02/18/2018 FINDINGS: MRI HEAD FINDINGS Brain: Examination severely limited due to extensive motion artifact. Advanced parenchymal volume loss with mild to moderate chronic microvascular ischemic disease. Large chronic left MCA territory infarct with associated encephalomalacia and gliosis. Additional chronic bilateral cerebellar infarcts, right greater than left. No definite foci of restricted diffusion to suggest acute or subacute ischemia. Gray-white matter differentiation otherwise maintained. No definite foci of susceptibility artifact to suggest acute or chronic intracranial hemorrhage. No appreciable mass lesion on this motion degraded exam. No midline shift or mass effect. No hydrocephalus. No appreciable extra-axial fluid collection. Pituitary gland grossly normal. Vascular: Abnormal flow void within the left ICA to approximately the level of the terminus, suggesting slow flow and/or occlusion. Major intracranial vascular flow voids otherwise grossly maintained at the skull base. Skull and upper cervical spine: Craniocervical junction within normal limits. No focal marrow replacing lesion. Scalp soft tissues unremarkable. Sinuses/Orbits: Globes and orbital soft tissues grossly within normal limits. Scattered mucosal thickening throughout the lobe paranasal sinuses. No air-fluid levels identified. Mastoid air cells clear. Other: None. MRA HEAD FINDINGS ANTERIOR CIRCULATION: Examination severely limited by extensive motion artifact. Absent flow related signal within the left ICA to the terminus, consistent with occlusion. Right ICA grossly patent to the terminus. Evaluation for stenosis limited due to extensive motion. A1 segments patent bilaterally.  Hypoplastic left A1. Grossly patent and normal anterior communicating artery. Anterior cerebral arteries grossly patent to their mid aspects. M1 segments patent bilaterally without occlusion or appreciable stenosis. Normal MCA bifurcations. Right MCA branches perfused. Left MCA branches perfused as well though appear attenuated as compared to the right, likely related to chronic left MCA territory infarct. POSTERIOR CIRCULATION: Vertebral arteries grossly patent at the skull base. Posterior inferior cerebral arteries patent proximally. Basilar grossly patent to its distal aspect. No appreciable basilar stenosis on this motion degraded exam. Superior cerebral arteries patent proximally. Both of the PCAs primarily supplied via the basilar, although small bilateral posterior communicating arteries noted. PCAs grossly patent to their distal aspects. Possible short-segment severe right P2 stenosis noted (series 1052, image 15). MRA NECK FINDINGS Examination is essentially nondiagnostic due to extensive motion artifact. Right common and internal carotid arteries grossly patent. Some flow related signal seen within the left CCA, although the left ICA not well seen and may be occluded. Partially visualized vertebral arteries patent with antegrade flow. Right vertebral artery appears dominant. Examination inadequate to evaluate for stenoses. IMPRESSION: MRI HEAD IMPRESSION: 1. Severely limited study due to extensive motion artifact. 2. No definite acute intracranial infarct or other abnormality identified. 3. 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 MRA HEAD IMPRESSION: 1. Severely limited exam due to extensive motion artifact. 2. Chronic left ICA occlusion. Distal reconstitution of the left MCA and ACA via flow across the circle-of-Willis. 3. Remainder of the major arterial vasculature of the head grossly patent without obvious  abnormality. MRA NECK IMPRESSION: 1. Essentially nondiagnostic MRA of the neck due to severe motion artifact. 2. Grossly patent right carotid artery system and vertebral arteries.  Partially visualized left CCA patent. Left ICA not visualized, and may be occluded within the neck. Electronically Signed   By: Rise MuBenjamin  McClintock M.D.   On: 01/25/2018 05:59   Dg Chest Portable 1 View  Result Date: 01/23/2018 CLINICAL DATA:  Acute presentation with altered mental status. EXAM: PORTABLE CHEST 1 VIEW COMPARISON:  12/30/2017 FINDINGS: Chronic cardiomegaly. Venous hypertension without edema. No effusions. No infiltrate, collapse or effusion. Aortic atherosclerosis. No acute bone finding. IMPRESSION: Chronic cardiomegaly and pulmonary venous hypertension. Aortic atherosclerosis. Electronically Signed   By: Paulina FusiMark  Shogry M.D.   On: 07/16/17 13:19   Dg Hip Unilat With Pelvis 1v Right  Result Date: 01/25/2018 CLINICAL DATA:  R/o foreign body for mri EXAM: DG HIP (WITH OR WITHOUT PELVIS) 1V RIGHT COMPARISON:  None. FINDINGS: Cannulated screw spans the sacrum. RIGHT hip is located. Remote fracture of the inferior pubic ramus IMPRESSION: No acute fracture dislocation. Cannulated screw spans the SI joints Electronically Signed   By: Genevive BiStewart  Edmunds M.D.   On: 01/25/2018 02:50   Ct Head Code Stroke Wo Contrast  Result Date: 02/16/2018 CLINICAL DATA:  Code stroke. Right-sided weakness. Possible seizure activity. EXAM: CT HEAD WITHOUT CONTRAST TECHNIQUE: Contiguous axial images were obtained from the base of the skull through the vertex without intravenous contrast. COMPARISON:  01/16/2018 FINDINGS: Brain: A large chronic left MCA territory infarct is again seen as well as chronic right larger than left cerebellar infarcts. There's no evidence of acute large territory infarct, intracranial hemorrhage, mass, midline shift, or extra-axial fluid collection. Periventricular white matter hypodensities are unchanged and  nonspecific but compatible with mild chronic small vessel ischemic disease. There is ex vacuo dilatation of the left lateral ventricle. Vascular: Calcified atherosclerosis including chronic calcified plaque or embolus in the proximal left MCA, unchanged. Skull: No acute fracture or suspicious osseous lesion. Sinuses/Orbits: Unremarkable orbits. Paranasal sinuses and mastoid air cells are clear. Other: Scattered foci of venous gas including in the cavernous sinuses, likely iatrogenic. ASPECTS [facility]([place] Stroke Program Early CT Score) Not scored in the setting of a large chronic MCA infarct. IMPRESSION: 1. No evidence of acute intracranial abnormality. 2. Large chronic left MCA infarct. 3. Chronic bilateral cerebellar infarcts. These results were called by telephone at the time of interpretation on 02/12/2018 at 11:45 am to [facility] View[facility]iree Metzger-Chelka, who verbally acknowledged these results. Electronically Signed   By: Sebastian AcheAllen  Grady M.D.   On: 07/16/17 11:58   EEG: This awake and asleep EEG is abnormal due to L focal slowing and muscle artifact. There were no electrographic seizures in this study.  Assessment/Plan:  Pt admitted from SNF as a code stroke; AMS. MRI neg for new stroke. 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 EEG neg for seizures. He has been restless and agitated overnight. LFTs are very elevated and he continues to have dyspnea. Primary team working up hepatic encephalopathy.   # Toxic metabolic encephalopathy- EEG did not show seizures of status. MRI did not show new structural issues/no new stroke. Extensive previous strokes would make for prolonged encephalopathy and poor neurologic reserve for a pt with hepatic disease.LFTs are quite elevated, baseline unknown. NH4 is 25. AKI also note which can be contributing factor. 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 Will need rehab efforts as  tol. High risk for delirium  # Dyspnea- work up per primary team. ddx hypercapnia, which could also cause encephalopathy.   Recommendations: Supportive care Hepatic work  up per primary team The brain lags behind any corrections to the metabolic dysfunction, would expect a very slow recovery in mentation, especially given the degree of underlying stroke damage. He has a poor neurologic reserve.  We will follow up

## 2018-01-25 NOTE — Progress Notes (Signed)
   Subjective: [name] continues to have altered mental status this morning.  He awakens with verbal stimulation but does not respond to questioning.  Did not have any acute events overnight.  Objective:  Vital signs in last 24 hours: Vitals:   01/25/18 0000 01/25/18 0429 01/25/18 1000 01/25/18 1142  BP: 114/78 114/79 95/67 (!) 89/71  Pulse: 90 97 95 95  Resp: (!) 25 (!) 33 (!) 28 10  Temp: (!) 97.4 F (36.3 C) 98.3 F (36.8 C) (!) 97.5 F (36.4 C) (!) 94.5 F (34.7 C)  TempSrc: Oral Oral Axillary Oral  SpO2: 93% 100% 96% 98%  Weight:      Height:       Physical Exam  Constitutional:  Sleep lying in bed in no acute distress.  Pulmonary/Chest:  No wheezes auscultated.  Normal breath sounds.  He does seem to have continued shallow breathing with intermittent increase in respirations.  He is oxygenating well in the room.  Neurological:  He continues to have altered mental status this morning.  He awakens to verbal stimuli but does not respond to questioning.    Assessment/Plan:  Active Problems:   Encephalopathy  [name] is a history of dementia and stroke who presented with encephalopathy.  We have ruled out ACS (negative troponins and unremarkable EKG) and infection (unremarkable chest x-ray and urinalysis). Ingestion unlikely given that he is given his regular medications by SNF.  CVA has not been completely ruled out by report MRI with constant motion.  Given that he is Eliquis was stopped several weeks ago CVA is still in our differential.  Will order a head CT to reevaluate for stroke (he cannot tolerate MRI and if an acute ischemic event occurred it is now over 24 hours and should show up on CT). He may also have had a new seizure secondary to his old strokes.  Additionally he was found to have elevated LFTs with abdominal ultrasound concerning for cholecystitis.  Will order HIDA scan today.  Acute encephalopathy: Continues to have altered mental status this  morning.  Patient neurology recommendations. - Follow-up head CT - We will consider antiepileptics pending CT results - Follow-up echo  Transaminitis: AST/ALT continue to be elevated. Complete abdominal ultrasound showed normal-appearing gallbladder with wall thickening.  Will order HIDA scan to evaluate for cholecystitis.  Hepatitis panel negative. - Follow-up HIDA scan  Acute kidney injury: BUN/Cre=22.84 suggest prerenal causes such as dehydration.  Will order fluids and repeat CMP in the morning. - Continue D5 half-normal saline with thiamine and folic acid.  CODE STATUS-full code DVT PPX: Heparin subcu FEN: NPO  Dispo: Anticipated discharge in 3-4 days.  Oscar Salazar, Oscar Salazar M, MD 01/25/2018, 11:44 AM Pager: [phone]

## 2018-01-25 NOTE — Progress Notes (Signed)
NM Hepato W/EjeCT Fract not completed due to agitation. MD made aware

## 2018-01-25 NOTE — Plan of Care (Signed)

## 2018-01-25 NOTE — Progress Notes (Signed)
Date: 01/25/2018  Patient name: Oscar Salazar  Medical record number: [id number]  Date of birth: Oct 29, 1940   I have seen and evaluated Oscar Salazar and discussed their care with the Residency Team.  Oscar Salazar is a 77 year old resident of [facility] facility.  The admitting team was unable to get a history from the patient due to encephalopathy and chronic dementia.  He has had a 3 to 39-monthslow decline in his physical abilities.  Previously, he was able to walk independently and was having full conversations.  Recently he was only able to walk a short distances and would stop it due to breathing difficulties and collapse.  Physical therapy did not improve his physical stamina.  In the past 3 or 4 weeks, he has only been able to walk about 30 feet before becoming dyspneic and collapsing.  Reportedly he was started on oxygen about 3 weeks ago.  He had an outpatient cardiology work-up without a reversible etiology being found and has been scheduled to see pulmonology as an outpatient.  On the morning of admission, he was found to have jerking movements and labored breathing and EMS was called.  An O2 sat was not able to be obtained and his SBP was in the 100s.  His work-up has included a CT of the head which showed no acute abnormalities.  He also had an MRI of the brain which was severely limited due to extensive movement that did not show any acute infarcts.  An EEG ruled out status epilepticus.  Lab results showed an elevated AST and ALT levels along with total bili and alk phos.  Of note, his Eliquis was stopped about 3 weeks ago when he saw cardiology.  They felt the risks exceeded the benefit.  I cannot find any reason why he was on Eliquis after reviewing the available notes and the EHR.  Vitals:   01/25/18 0429 01/25/18 1000  BP: 114/79 95/67  Pulse: 97 95  Resp: (!) 33 (!) 28  Temp: 98.3 F (36.8 C) (!) 97.5 F (36.4 C)  SpO2: 100% 96%  No documented fever No documented  hypoxia Gen alert but not interactive Cheyne-Stokes type breathing LCTAB without wheeze / rales  Creatinine 1.97 -1.82 (1.2 in Feb) Alk phos 123 -134 AST 259 - 356  ALT 429 -496 T bili 2.2 -2.7 Troponin negative WBC 8.1 Hemoglobin 13.9 Hep B surface antigen negative UA essentially negative  I personally viewed the CXR images and confirmed my reading with the official read. AP portable, overpenetrated, no gross abnl  I personally viewed the EKG and confirmed my reading with the official read. Sinus, nl axis, inf lateral TWI which are old  Abdominal ultrasound showed mild gallbladder wall thickening without stones.  Some gallbladder sludge.  Questionable Murphy sign.  Increased liver echogenicity.  Assessment and Plan: I have seen and evaluated the patient as outlined above. I agree with the formulated Assessment and Plan as detailed in the residents' note, with the following changes: Oscar Salazar a 77year old resident of [facility] with a history of dementia and stroke.  He has had a 341-monthradual decline in his health but had an acute abrupt change on the morning of admission.  We have ruled out ACS as an etiology with troponin and EKG.  Ingestion seems highly unlikely.  CVA has not completely been ruled out as his MRI was poor due to constant motion.  We do not know why he was on Eliquis and with it  being stopped 3 weeks ago, CVS is still in the differential diagnosis.  Since he cannot comply with an MRI we will recheck a CT scan now that 24 hours has passed since his decline.  Other status epilepticus has been ruled out, seizure has not.  He is at risk for epilepsy due to his history of CVAs.  Once we get his repeat CT scan, we will discuss further antiepileptics are indicated with neurology.  We were not concerned initially with infection as his chest x-ray, UA and exam are not concerning for infection as a cause of his encephalopathy.  However his LFTs are abnormal  and his abdominal ultrasound cannot rule out cholecystitis so we will follow through with the recommended HIDA scan.  Metabolic has also been ruled out as his electrolytes and glucose are normal.  1.  Acute encephalopathy - diff dx currently includes acute CVA, new onset seizure disorder, or cholecystitis.  Repeat head CT, consider antiepileptics, obtain HIDA scan.  2.  Elevated LFTs -acute hepatitis B has been ruled out.  If we were concerned about acute hepatitis C, we will need to get a hepatitis C viral load but my pretest probability is low.  We will proceed with HIDA scan.  3.  Dementia -his underlying dementia is going to make resolution of his acute encephalopathy prolonged.  He will be at risk for continued encephalopathy due to being in a new environment, hooked up to telemetry, altered sleep cycle, and possibly cholecystitis.  Behavioral interventions.  Antipsychotics like Haldol only for patient or staff safety.   Oscar Crews, MD 12/7/201910:35 AM

## 2018-01-26 ENCOUNTER — Inpatient Hospital Stay (HOSPITAL_COMMUNITY): Payer: Medicare Other

## 2018-01-26 ENCOUNTER — Encounter (HOSPITAL_COMMUNITY): Payer: Self-pay

## 2018-01-26 DIAGNOSIS — R0682 Tachypnea, not elsewhere classified: Secondary | ICD-10-CM

## 2018-01-26 DIAGNOSIS — R570 Cardiogenic shock: Secondary | ICD-10-CM

## 2018-01-26 DIAGNOSIS — R410 Disorientation, unspecified: Secondary | ICD-10-CM

## 2018-01-26 DIAGNOSIS — F0280 Dementia in other diseases classified elsewhere without behavioral disturbance: Secondary | ICD-10-CM

## 2018-01-26 DIAGNOSIS — I236 Thrombosis of atrium, auricular appendage, and ventricle as current complications following acute myocardial infarction: Secondary | ICD-10-CM

## 2018-01-26 DIAGNOSIS — I5021 Acute systolic (congestive) heart failure: Secondary | ICD-10-CM

## 2018-01-26 DIAGNOSIS — I471 Supraventricular tachycardia: Secondary | ICD-10-CM

## 2018-01-26 LAB — CBC WITH DIFFERENTIAL/PLATELET
Abs Immature Granulocytes: 0.05 10*3/uL (ref 0.00–0.07)
Basophils Absolute: 0 10*3/uL (ref 0.0–0.1)
Basophils Relative: 1 %
Eosinophils Absolute: 0 10*3/uL (ref 0.0–0.5)
Eosinophils Relative: 0 %
HCT: 45.7 % (ref 39.0–52.0)
Hemoglobin: 15 g/dL (ref 13.0–17.0)
Immature Granulocytes: 1 %
Lymphocytes Relative: 20 %
Lymphs Abs: 1.7 10*3/uL (ref 0.7–4.0)
MCH: 30 pg (ref 26.0–34.0)
MCHC: 32.8 g/dL (ref 30.0–36.0)
MCV: 91.4 fL (ref 80.0–100.0)
Monocytes Absolute: 1.5 10*3/uL — ABNORMAL HIGH (ref 0.1–1.0)
Monocytes Relative: 17 %
Neutro Abs: 5.5 10*3/uL (ref 1.7–7.7)
Neutrophils Relative %: 61 %
Platelets: 160 10*3/uL (ref 150–400)
RBC: 5 MIL/uL (ref 4.22–5.81)
RDW: 17 % — ABNORMAL HIGH (ref 11.5–15.5)
WBC: 8.8 10*3/uL (ref 4.0–10.5)
nRBC: 1.1 % — ABNORMAL HIGH (ref 0.0–0.2)

## 2018-01-26 LAB — BASIC METABOLIC PANEL
Anion gap: 11 (ref 5–15)
Anion gap: 14 (ref 5–15)
BUN: 50 mg/dL — ABNORMAL HIGH (ref 8–23)
BUN: 54 mg/dL — ABNORMAL HIGH (ref 8–23)
CALCIUM: 8.3 mg/dL — AB (ref 8.9–10.3)
CO2: 17 mmol/L — ABNORMAL LOW (ref 22–32)
CO2: 13 mmol/L — ABNORMAL LOW (ref 22–32)
Calcium: 8.2 mg/dL — ABNORMAL LOW (ref 8.9–10.3)
Chloride: 113 mmol/L — ABNORMAL HIGH (ref 98–111)
Chloride: 116 mmol/L — ABNORMAL HIGH (ref 98–111)
Creatinine, Ser: 2.03 mg/dL — ABNORMAL HIGH (ref 0.61–1.24)
Creatinine, Ser: 2.17 mg/dL — ABNORMAL HIGH (ref 0.61–1.24)
GFR calc Af Amer: 36 mL/min — ABNORMAL LOW (ref >60)
GFR calc Af Amer: 33 mL/min — ABNORMAL LOW (ref >60)
GFR calc non Af Amer: 31 mL/min — ABNORMAL LOW (ref >60)
GFR calc non Af Amer: 29 mL/min — ABNORMAL LOW (ref >60)
Glucose, Bld: 182 mg/dL — ABNORMAL HIGH (ref 70–99)
Glucose, Bld: 124 mg/dL — ABNORMAL HIGH (ref 70–99)
Potassium: 4.7 mmol/L (ref 3.5–5.1)
Potassium: 5.4 mmol/L — ABNORMAL HIGH (ref 3.5–5.1)
Sodium: 141 mmol/L (ref 135–145)
Sodium: 143 mmol/L (ref 135–145)

## 2018-01-26 LAB — COMPREHENSIVE METABOLIC PANEL
ALT: 762 U/L — ABNORMAL HIGH (ref 0–44)
AST: 803 U/L — ABNORMAL HIGH (ref 15–41)
Albumin: 3.1 g/dL — ABNORMAL LOW (ref 3.5–5.0)
Alkaline Phosphatase: 128 U/L — ABNORMAL HIGH (ref 38–126)
Anion gap: 17 — ABNORMAL HIGH (ref 5–15)
BUN: 53 mg/dL — ABNORMAL HIGH (ref 8–23)
CO2: 12 mmol/L — ABNORMAL LOW (ref 22–32)
Calcium: 8.5 mg/dL — ABNORMAL LOW (ref 8.9–10.3)
Chloride: 115 mmol/L — ABNORMAL HIGH (ref 98–111)
Creatinine, Ser: 1.92 mg/dL — ABNORMAL HIGH (ref 0.61–1.24)
GFR calc Af Amer: 38 mL/min — ABNORMAL LOW (ref >60)
GFR calc non Af Amer: 33 mL/min — ABNORMAL LOW (ref >60)
Glucose, Bld: 165 mg/dL — ABNORMAL HIGH (ref 70–99)
Potassium: 5.3 mmol/L — ABNORMAL HIGH (ref 3.5–5.1)
Sodium: 144 mmol/L (ref 135–145)
Total Bilirubin: 3.2 mg/dL — ABNORMAL HIGH (ref 0.3–1.2)
Total Protein: 6.2 g/dL — ABNORMAL LOW (ref 6.5–8.1)

## 2018-01-26 LAB — LACTIC ACID, PLASMA
LACTIC ACID, VENOUS: 4.8 mmol/L — AB (ref 0.5–1.9)
Lactic Acid, Venous: 8.7 mmol/L (ref 0.5–1.9)
Lactic Acid, Venous: 4.9 mmol/L (ref 0.5–1.9)
Lactic Acid, Venous: 3.6 mmol/L (ref 0.5–1.9)
Lactic Acid, Venous: 8 mmol/L (ref 0.5–1.9)

## 2018-01-26 LAB — BLOOD GAS, ARTERIAL
Acid-base deficit: 14.9 mmol/L — ABNORMAL HIGH (ref 0.0–2.0)
Bicarbonate: 10.7 mmol/L — ABNORMAL LOW (ref 20.0–28.0)
Drawn by: 51155
O2 Content: 4 L/min
O2 Saturation: 96.3 %
Patient temperature: 98.6
pCO2 arterial: 24.4 mmHg — ABNORMAL LOW (ref 32.0–48.0)
pH, Arterial: 7.267 — ABNORMAL LOW (ref 7.350–7.450)
pO2, Arterial: 107 mmHg (ref 83.0–108.0)

## 2018-01-26 LAB — TROPONIN I: Troponin I: 0.08 ng/mL (ref <0.03)

## 2018-01-26 LAB — HEPARIN LEVEL (UNFRACTIONATED)
Heparin Unfractionated: 0.48 IU/mL (ref 0.30–0.70)
Heparin Unfractionated: 0.86 IU/mL — ABNORMAL HIGH (ref 0.30–0.70)

## 2018-01-26 LAB — MAGNESIUM: Magnesium: 2.5 mg/dL — ABNORMAL HIGH (ref 1.7–2.4)

## 2018-01-26 LAB — PHOSPHORUS: Phosphorus: 4.3 mg/dL (ref 2.5–4.6)

## 2018-01-26 LAB — GLUCOSE, CAPILLARY: Glucose-Capillary: 147 mg/dL — ABNORMAL HIGH (ref 70–99)

## 2018-01-26 MED ORDER — FUROSEMIDE 10 MG/ML IJ SOLN
10.0000 mg/h | INTRAVENOUS | Status: DC
  Administered 2018-01-26: 10 mg/h via INTRAVENOUS
  Filled 2018-01-26 (×2): qty 25

## 2018-01-26 MED ORDER — ORAL CARE MOUTH RINSE
15.0000 mL | Freq: Two times a day (BID) | OROMUCOSAL | Status: DC
  Administered 2018-01-26 – 2018-01-27 (×3): 15 mL via OROMUCOSAL

## 2018-01-26 MED ORDER — CHLORHEXIDINE GLUCONATE 0.12 % MT SOLN
15.0000 mL | Freq: Two times a day (BID) | OROMUCOSAL | Status: DC
  Administered 2018-01-26 – 2018-01-27 (×4): 15 mL via OROMUCOSAL

## 2018-01-26 MED ORDER — METOPROLOL TARTRATE 5 MG/5ML IV SOLN
5.0000 mg | Freq: Once | INTRAVENOUS | Status: AC
  Administered 2018-01-26: 5 mg via INTRAVENOUS

## 2018-01-26 MED ORDER — AMIODARONE HCL IN DEXTROSE 360-4.14 MG/200ML-% IV SOLN
30.0000 mg/h | INTRAVENOUS | Status: DC
  Administered 2018-01-27: 30 mg/h via INTRAVENOUS
  Filled 2018-01-26: qty 200

## 2018-01-26 MED ORDER — SODIUM CHLORIDE 0.9 % IV BOLUS
1000.0000 mL | Freq: Once | INTRAVENOUS | Status: AC
  Administered 2018-01-26: 1000 mL via INTRAVENOUS

## 2018-01-26 MED ORDER — METOPROLOL TARTRATE 5 MG/5ML IV SOLN
2.5000 mg | INTRAVENOUS | Status: DC | PRN
  Administered 2018-01-26: 5 mg via INTRAVENOUS
  Filled 2018-01-26: qty 5

## 2018-01-26 MED ORDER — METOPROLOL TARTRATE 5 MG/5ML IV SOLN
INTRAVENOUS | Status: AC
  Administered 2018-01-26: 5 mg
  Filled 2018-01-26: qty 5

## 2018-01-26 MED ORDER — METOPROLOL TARTRATE 5 MG/5ML IV SOLN: 5.0000 mg | Freq: Four times a day (QID) | INTRAVENOUS | Status: DC

## 2018-01-26 MED ORDER — ADENOSINE 6 MG/2ML IV SOLN
INTRAVENOUS | Status: AC
  Filled 2018-01-26: qty 6

## 2018-01-26 MED ORDER — FUROSEMIDE 10 MG/ML IJ SOLN
80.0000 mg | INTRAMUSCULAR | Status: AC
  Administered 2018-01-26: 80 mg via INTRAVENOUS
  Filled 2018-01-26: qty 8

## 2018-01-26 MED ORDER — HEPARIN (PORCINE) 25000 UT/250ML-% IV SOLN
1050.0000 [IU]/h | INTRAVENOUS | Status: DC
  Administered 2018-01-26: 1200 [IU]/h via INTRAVENOUS
  Administered 2018-01-27: 1050 [IU]/h via INTRAVENOUS
  Filled 2018-01-26 (×2): qty 250

## 2018-01-26 MED ORDER — AMIODARONE HCL IN DEXTROSE 360-4.14 MG/200ML-% IV SOLN
60.0000 mg/h | INTRAVENOUS | Status: AC
  Administered 2018-01-26 (×2): 60 mg/h via INTRAVENOUS
  Filled 2018-01-26 (×2): qty 200

## 2018-01-26 NOTE — Consult Note (Addendum)
Cardiology Consultation:   Patient ID: Maurio Baize; [id number]; May 17, 1940   Admit date: 01/22/2018 Date of Consult: 01/26/2018  Primary Care Provider: Shelbie Ammons, MD Primary Cardiologist: No primary care provider on file. Primary Electrophysiologist:  None  Chief Complaint: low LV EF  Patient Profile:   Luiz Trumpower is a 77 y.o. 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     History of Present Illness:   Per documentation in the chart patient has been declining physically for the past several weeks and not able to walk as far due to shortness of breath and weakness.   On the morning of admission, he was normal at 8 AM but went to the bathroom where he was found by staff unresponsive and shaking. EMS was called for possible seizure.  Patient was seen by neurology in the emergency room and was noted to have right-sided weakness, which was felt to be chronic related to his prior stroke.  Since admission he has remained confused and altered.  He was noted to have elevated LFTs for unclear reasons.  He also has acute kidney injury.  Echocardiogram done yesterday demonstrated LVEF of 10 to 15%.  Apical thrombus was noted.  He was started on IV heparin.  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  The floor team did carotid sinus massage and gave IV metoprolol.  This broke the tachycardia but patient became hypotensive afterward with a systolic blood pressure in the 50s.  He was given IV fluids and eventually his blood pressure improved.    Lactate was checked early this morning and returned 8.7.  He has not made any urine in the past 24 hours (no diuretics given).  Patient is currently somnolent and unable to provide  any history.  Past Medical History:  Diagnosis Date  . Benign prostate hyperplasia   . Diabetes mellitus without complication (HCC)   . Dysphagia   . Hyperlipemia   . Hypertension   . Stroke [facility])     History reviewed. No pertinent surgical history.   Inpatient Medications: Scheduled Meds: . mouth rinse  15 mL Mouth Rinse BID  . metoprolol tartrate  5 mg Intravenous Q6H   Continuous Infusions: . dextrose 5 % and 0.9% NaCl 125 mL/hr at 01/26/18 0109  . heparin 1,200 Units/hr (01/26/18 0406)   PRN Meds: iopamidol  Home Meds: Prior to Admission medications   Medication Sig Start Date End Date Taking? Authorizing Provider  acyclovir (ZOVIRAX) 400 MG tablet Take 400 mg by mouth 2 (two) times daily. 04/01/17  Yes [provider]  allopurinol (ZYLOPRIM) 100 MG tablet Take 100 mg by mouth daily. 04/01/17  Yes [provider]  atorvastatin (LIPITOR) 40 MG tablet Take 40 mg by mouth at bedtime. 04/01/17  Yes [provider]  clopidogrel (PLAVIX) 75 MG tablet Take 75 mg by mouth daily. 04/01/17  Yes [provider]  furosemide (LASIX) 20 MG tablet Take 20 mg by mouth daily.  04/01/17  Yes [provider]  loratadine (CLARITIN) 10 MG tablet Take 10 mg by mouth daily.   Yes [provider]  metFORMIN (GLUCOPHAGE) 500 MG tablet Take 250 mg by mouth 2 (two) times daily with a meal.  04/02/17  Yes [provider]  metoprolol tartrate (  LOPRESSOR) 25 MG tablet Take 25 mg by mouth 2 (two) times daily. 04/01/17  Yes [provider]  Multiple Vitamin (MULTIVITAMIN WITH MINERALS) TABS tablet Take 1 tablet by mouth daily.   Yes [provider]  OXYGEN Inhale 2 L into the lungs continuous.   Yes [provider]  pregabalin (LYRICA) 25 MG capsule Take 25 mg by mouth 2 (two) times daily.   Yes [provider]  sertraline (ZOLOFT) 25 MG tablet Take 25 mg by mouth daily. For anxiety   Yes [provider]    tamsulosin (FLOMAX) 0.4 MG CAPS capsule Take 0.4 mg by mouth daily. 04/01/17  Yes [provider]    Allergies:   No Known Allergies  Social History:   Social History   Socioeconomic History  . Marital status: Married    Spouse name: Not on file  . Number of children: Not on file  . Years of education: Not on file  . Highest education level: Not on file  Occupational History  . Not on file  Social Needs  . Financial resource strain: Not on file  . Food insecurity:    Worry: Not on file    Inability: Not on file  . Transportation needs:    Medical: Not on file    Non-medical: Not on file  Tobacco Use  . Smoking status: Current Every Day Smoker    Packs/day: 0.50    Types: Cigarettes  Substance and Sexual Activity  . Alcohol use: No    Frequency: Never  . Drug use: No  . Sexual activity: Not on file  Lifestyle  . Physical activity:    Days per week: Not on file    Minutes per session: Not on file  . Stress: Not on file  Relationships  . Social connections:    Talks on phone: Not on file    Gets together: Not on file    Attends religious service: Not on file    Active member of club or organization: Not on file    Attends meetings of clubs or organizations: Not on file    Relationship status: Not on file  . Intimate partner violence:    Fear of current or ex partner: Not on file    Emotionally abused: Not on file    Physically abused: Not on file    Forced sexual activity: Not on file  Other Topics Concern  . Not on file  Social History Narrative  . Not on file    Family History:   The patient's family history is not on file.  ROS:  Unable to perform ROS due to AMS.  Physical Exam/Data:   Vitals:   01/26/18 0330 01/26/18 0345 01/26/18 0400 01/26/18 0406  BP: 92/76 105/61 107/66   Pulse: (!) 103  (!) 103 (!) 102  Resp: (!) 41 (!) 44 (!) 43 (!) 41  Temp:      TempSrc:      SpO2: 100%  100% 100%  Weight:      Height:        Intake/Output  Summary (Last 24 hours) at 01/26/2018 0437 Last data filed at 01/25/2018 1706 Gross per 24 hour  Intake 835.61 ml  Output -  Net 835.61 ml   Filed Weights   01/31/2018 1100 02/12/2018 1220  Weight: 86.4 kg 86.4 kg   Body mass index is 28.13 kg/m.  General: somnolent, will not respond to questions Head: Normocephalic, atraumatic Neck: JVD elevated to the mandible  Lungs: Bilateral crackles Heart: RRR with S1 S2.  Abdomen: Soft, non-tender, non-distended with normoactive bowel sounds. No hepatomegaly. No rebound/guarding. No obvious abdominal masses. Msk:  Strength and tone appear normal for age. Extremities: No clubbing or cyanosis. Minimal edema.  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  Relevant CV Studies: TTE 01/25/2018: LV EF 10-15%, apical thrombus.  Laboratory Data:  Chemistry Recent Labs  Lab 01/30/2018 1149 01/25/18 0709 01/26/18 0151  NA 137 141 144  K 4.7 4.8 5.3*  CL 103 107 115*  CO2 18* 19* 12*  GLUCOSE 152* 145* 165*  BUN 45* 50* 53*  CREATININE 1.97* 1.82* 1.92*  CALCIUM 8.6* 8.8* 8.5*  GFRNONAA 32* 35* 33*  GFRAA 37* 41* 38*  ANIONGAP 16* 15 17*    Recent Labs  Lab 01/23/2018 1149 01/25/18 0709 01/26/18 0151  PROT 5.8* 6.1* 6.2*  ALBUMIN 2.8* 3.1* 3.1*  AST 259* 356* 803*  ALT 429* 496* 762*  ALKPHOS 123 134* 128*  BILITOT 2.2* 2.7* 3.2*   Hematology Recent Labs  Lab 01/19/2018 1149 01/25/18 0709 01/26/18 0151  WBC 8.3 8.1 8.8  RBC 4.61 4.71 5.00  HGB 14.0 13.9 15.0  HCT 43.2 42.5 45.7  MCV 93.7 90.2 91.4  MCH 30.4 29.5 30.0  MCHC 32.4 32.7 32.8  RDW 17.0* 16.8* 17.0*  PLT 155 150 160   Cardiac EnzymesNo results for input(s): TROPONINI in the last 168 hours.  Recent Labs  Lab 02/07/2018 1114  TROPIPOC 0.01    BNPNo results for input(s): BNP, PROBNP in the last 168 hours.    DDimer No results for input(s): DDIMER in the last 168 hours.   Assessment and Plan:   1. Cardiogenic shock Suspect that his decline over the past 3-4 weeks is at least partially related to worsening HF.  He has several lab abnormalities, including elevated LFTs and Cr.  This may at least in part be due to congestion and/or low output HF.  His lactate was very elevated this morning - this may be partially due to the prolonged episode of hypotension that he had after receiving IV metoprolol and worsening of his hemodynamics with IV fluids.  Would recommend stopping all IV fluid and aggressive IV diuresis.  If no urine output or lacate is not declining on recheck in 1-2 hours, would start an inotrope.  Would avoid IV beta blockers - suspect this worsened patient's hemodynamics this morning.  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  If he has further episodes, would recommend IV amiodarone as a temporizing measure to prevent further occurrences while we treat his HF.   3. Apical thrombus Would continue IV heparin    For questions or updates, please contact [facility] Please consult [email or web address] for contact info under Cardiology/STEMI.    Allen Derry, MD  01/26/2018 4:37 AM

## 2018-01-26 NOTE — Progress Notes (Addendum)
[name] is admit for workup of acute encephalopathy, currently he is beign evaluated for CVA vs new onset seizure disorder and was found to have elevated LFTs and concern for cholecystitis for which a HIDA scan is being obtained.    We were called to the room at 1:45 AM for telemetry changes concerning for tachycardia with a ventricular rate of 155-165 bpm. An EKG was obtained and confirmed SVT, AVNRT.   [name] acknowledged our questions but was not able to communicate whether he was feeling symptoms to us. His oxygen saturation is 100% and blood pressure 116/92.  He exhibited shallow regular breathing which is consistent with his breathing from earlier today. His heart sounds were regular but fast, there was no murmur or gallop. His extremities are cool to touch.   I attempted carotid massage, this did not work to resolve the tachycardia. IV metoprolol 5 mg was obtained from the [facility] and administered. After a few minutes his heart rate reduced to 150 then gradually reduced to 90. He went into a sinus rhythm with premature ventricular contractions then sustained about a 10 beat run of vtach followed by ventricular bigeminy then sinus rhythm. A follow up ekg showed junctional rhythm with inferior lateral T wave inversions consistent with prior.   Echocardiogram from earlier today was reviewed: it showed EF 10-15%, diffuse hypokinesia, and there was a thrombus noted in the left ventricle. Labs from earlier today were reviewed and significant for an elevated creatinine, low bicarb with normal anion gap, potassium, and sodium. Troponin at the time of admission was negative. On review of his home medication list it appears that he was on metoprolol tartrate at home and this was held at the time of admission, this was probably for his low normal blood pressure and bradycardia.   We obtained a chest xray which was unchanged from prior. We obtained labwork - VBG, BMP, mag, phos, troponin, CBC. We  started IV metoprolol 5 mg q6h with hold parameters. We transitioned him from heparin for VTE prophylaxis to heparin for VTE treatment.   At 2:30 he was noted to be hypotensive with BP 56/43. We initiated IV fluid boluses and after about 350 cc his blood pressure improved to 103/77. Cardiology was consulted and they were in agreement with the plan.   CMP returned with further elevation in transaminase and creatinine. Bicarb has decreased to 12 and ABG confirmed metabolic acidosis with pH 7.26. His nurse reports that he had no urine output today. Lactic acid and troponin are in process. I am concerned this is representative of shock, I spoke with the on call cardiologist and they have agreed to see him tonight.

## 2018-01-26 NOTE — Progress Notes (Signed)
ANTICOAGULATION CONSULT NOTE  Pharmacy Consult for Heparin  Indication: LV thrombus  No Known Allergies  Patient Measurements: Height: 5\' 9"  (175.3 cm) Weight: 190 lb 7.6 oz (86.4 kg) IBW/kg (Calculated) : 70.7  Vital Signs: BP: 96/72 (12/08 1300) Pulse Rate: 76 (12/08 1300)  Labs: Recent Labs    01/23/2018 1149 01/25/18 0709 01/26/18 0151 01/26/18 0223 01/26/18 0951 01/26/18 1327  HGB 14.0 13.9 15.0  --   --   --   HCT 43.2 42.5 45.7  --   --   --   PLT 155 150 160  --   --   --   APTT 37*  --   --   --   --   --   LABPROT 23.3*  --   --   --   --   --   INR 2.10  --   --   --   --   --   HEPARINUNFRC  --   --   --   --   --  0.48  CREATININE 1.97* 1.82* 1.92*  --  2.03*  --   TROPONINI  --   --   --  0.08*  --   --     Estimated Creatinine Clearance: 33.7 mL/min (A) (by C-G formula based on SCr of 2.03 mg/dL (H)).   Medical History: Past Medical History:  Diagnosis Date  . Benign prostate hyperplasia   . Diabetes mellitus without complication (HCC)   . Dysphagia   . Hyperlipemia   . Hypertension   . Stroke [facility](HCC)     Assessment: 77 y/o M came in as a code stroke on 12/6, still undergoing stroke work-up, has a hx of stroke previously on Apixaban, but that was stopped about one month ago. Pt found to have LV thrombus on ECHO>>starting heparin. CBC good, noted renal dysfunction. INR was elevated at 2.10 on 12/6, but this is likely due to his liver dysfunction as AST and ALT are 803 and 762.   Initial heparin level within goal range.  No overt bleeding or complications noted.  CBC stable.  Goal of Therapy:  Heparin level 0.3-0.5 units/ml, still undergoing stroke work-up Monitor platelets by anticoagulation protocol: Yes   Plan:  Continue IV heparin at current rate. Confirm heparin level in 8 hrs. Daily CBC and heparin level. Monitor for bleeding  Jenetta DownerJessica Sudie Bandel, Pharm D, BCPS, [facility] Clinical Pharmacist Phone [phone]  01/26/2018 2:11 PM

## 2018-01-26 NOTE — Progress Notes (Signed)
  Amiodarone Drug - Drug Interaction Consult Note  Recommendations: None, monitor for now  Amiodarone is metabolized by the cytochrome P450 system and therefore has the potential to cause many drug interactions. Amiodarone has an average plasma half-life of 50 days (range 20 to 100 days).   There is potential for drug interactions to occur several weeks or months after stopping treatment and the onset of drug interactions may be slow after initiating amiodarone.   []  Statins: Increased risk of myopathy. Simvastatin- restrict dose to 20mg  daily. Other statins: counsel patients to report any muscle pain or weakness immediately.  []  Anticoagulants: Amiodarone can increase anticoagulant effect. Consider warfarin dose reduction. Patients should be monitored closely and the dose of anticoagulant altered accordingly, remembering that amiodarone levels take several weeks to stabilize.  []  Antiepileptics: Amiodarone can increase plasma concentration of phenytoin, the dose should be reduced. Note that small changes in phenytoin dose can result in large changes in levels. Monitor patient and counsel on signs of toxicity.  []  Beta blockers: increased risk of bradycardia, AV block and myocardial depression. Sotalol - avoid concomitant use.  []   Calcium channel blockers (diltiazem and verapamil): increased risk of bradycardia, AV block and myocardial depression.  []   Cyclosporine: Amiodarone increases levels of cyclosporine. Reduced dose of cyclosporine is recommended.  []  Digoxin dose should be halved when amiodarone is started.  [x]  Diuretics: increased risk of cardiotoxicity if hypokalemia occurs.  []  Oral hypoglycemic agents (glyburide, glipizide, glimepiride): increased risk of hypoglycemia. Patient's glucose levels should be monitored closely when initiating amiodarone therapy.   []  Drugs that prolong the QT interval:  Torsades de pointes risk may be increased with concurrent use - avoid if  possible.  Monitor QTc, also keep magnesium/potassium WNL if concurrent therapy can't be avoided. Marland Kitchen. Antibiotics: e.g. fluoroquinolones, erythromycin. . Antiarrhythmics: e.g. quinidine, procainamide, disopyramide, sotalol. . Antipsychotics: e.g. phenothiazines, haloperidol.  . Lithium, tricyclic antidepressants, and methadone. Thank You,  Gardner CandleCarney, Warner Laduca C  01/26/2018 10:42 AM

## 2018-01-26 NOTE — Progress Notes (Signed)
SVT 150 noted on bedside EKG.  Dr. Vassie LollAlva on unit. Metoprolol 5mg  given per order.   Dr. Duke Salviaandolph at bedside.  SR 60 with frequent PVC noted.  Will continue to closely monitor.

## 2018-01-26 NOTE — Progress Notes (Signed)
Attempted to call relative to update on patient's status and pending transfer to ICU.  Unable to reach wife.  No other contact listed.

## 2018-01-26 NOTE — Significant Event (Addendum)
Rapid Response Event Note  Overview: "Second set of eyes"   Initial Focused Assessment: Called by charge RN to assess patient. Upon arrival, patient's RR was in the mid 40s - shallow/rapid respirations, + use of accessory muscles, saturations > 98% on 4L [place]. HR now in the upper 90s - low 100s, per RN, HR increased into the 160s - treated with Metoprolol 5 mg IV, subsequently became hypotensive (SBP were in the 50s). Patient was given 350 NS bolus and SBP improved > 100. Skin very cool to touch, dusky fingers and toes, peripherally cool to touch, + pulses (thready), lung sounds clear in all fields. Voided x 1 in last 24 hours, has not made any urine tonight. Labs were done prior to RR RN arrival. CO2 - 12, worsening CR/BUN as well as LFTS. ABG was ordered as well as a heparin drip (apical thrombus). Mental status has remained the same, periods of agitation, does not follow commands, somnolent, question overall airway protection due to mental status. MIVF @ 125cc/hr  Interventions: - ABG : pH 7.26, pCO2 24, pO2 107, ABD 14.9, HCO3 10.7 - STAT LACTIC ACID - Heparin drip started 1200 units/hr  Plan of Care: - I paged IMTS MD at [phone] with ABG results - updated MD as well.  - Metabolic Acidosis -? Cardiogenic Shock - ? - IMTS MD consulted CARDS MD, MD to come see patient.  - CARDS MD - Lasix 80 mg IV and stop MIVF - LA - 8.7 paged IMTS at 0516, IMTS MD called back. We discussed the patient, I explained that a patient with LA of 8.7, ongoing metabolic acidosis, and AMS needs to be transferred to ICU. I suggested that CCM be consulted. We discussed placing a foley catheter (patient will be on Lasix drip as well) and repeating labs in a few hours. I also asked that IMTS speak with the patient's family if possible about GOC or Code Status.   Event Summary:   at    Call Time 0335 Arrival Time 0337 End Time : Pending   Oscar Salazar R

## 2018-01-26 NOTE — Progress Notes (Addendum)
PCCM:  I was paged by Monia SabalJennifer Mlekush, RN from to heart cardiovascular ICU informed that the patient is now on 100% nonrebreather and progressive lactic acidosis and hypotensive.  Patient was seen by our critical care service this morning on rounds. Review of the chart revealed evidence of cardiogenic shock a newly depressed ejection fraction. There has been multiple attempts at reaching family throughout the day.  Patient has become progressively more hypotensive and hypoxemic.  We called the skilled facility that he was living at and obtained telephone numbers that they had of family from there.  We called the patient's sister who stated that his medical power of attorney was his brother [name].  We obtained the telephone number to reach [name] which is [phone].   I called and spoke with [name] who lives on the other side of [place].  He was informed of his brothers critical condition and admission to the intensive care unit.  I explained that he would likely need life support to include mechanical ventilation and medications to increase his blood pressure as he has become progressively hypotensive. He stated that his brother was a longtime smoker probably and has bad lung disease and that would not want to be placed on life support.  At this point we discussed regarding doing everything that we could keep him alive, accept doing CPR, mechanical ventilation or central line placement to include blood pressure medications.  [name] stated that his brother would NOT want this and that he would not want life support. Of note the patient is widowed.  He does have a son but per the sister him and his son do not communicate and I am not spoken in several years. The decision for the patient to be made a DNR was confirmed with his nurse via phone.  The current attending is listed as Dr. Rogelia BogaButcher from the teaching service.  I will contact the teaching service and inform them of the DNR code discussion that was  had via phone.  As the decision has been made for no escalation of care.  Patient no longer needs intensive care unit status can could be downgraded for palliation.   CC Time: 42 mins of care coordination and phone calls regarding code status discussion and palliative care.  I updated Dr. Frances FurbishWinfrey from the internal medicine teaching service.  Josephine IgoBradley L , DO [facility] 01/26/2018 6:10 PM  Personal pager: [phone] If unanswered, please page CCM On-call: [phone]

## 2018-01-26 NOTE — Progress Notes (Addendum)
   Subjective: Pt will wake up briefly but not coherent in his answers.    Objective:  Vital signs in last 24 hours: Vitals:   01/26/18 0600 01/26/18 0715 01/26/18 0730 01/26/18 0745  BP: 114/80 101/70 (!) 116/98   Pulse: 63  97   Resp: (!) 34 (!) 34 18   Temp:      TempSrc:      SpO2: 100%  100% 100%  Weight:      Height:       Cardiac: normal rate and rhythm, clear s1 and s2, faint TR murmur, no rubs or gallops Pulmonary: CTAB, not in distress Abdominal: non distended abdomen, soft and nontender Extremities: no LE edema Psych: somnolent    Assessment/Plan:  Active Problems:   Encephalopathy  Encephalopathy: likely multifactorial, patient with poor functional reserve of brain from prior strokes, imaging this admission has been poor.  Originally not acidotic actually alkalotic, bicarb a little low but no anion gap with apical thrombus in LV ? Of new brainstem CVA.   Overnight had what appears to have been SVT possibly an atrial flutter was given IV metoprolol and went into cardiogenic shock.  Subsequent labs consistent with worsening transaminases and creatinine from hypotensive episode.    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  Despite this chest x-ray and breath sounds overall clear but he had very little reserve to tolerate his SVT/metop and went into cardiogenic shock.     -now on lasix, transferred to ICU for hemodynamic support.    Transaminitis: AST/ALT elevated to 200's on admission.  Now worsened likely a component of congestive hepatopathy, fatty liver and most recently cardiogenic shock  -May improve somewhat with hemodynamic improvement  Acute kidney injury:prerenal improved slightly with fluids initially, now worsened by hypotension, cardiogenic shock.    -hemodynamic management, transferred to ICU, may require inotropes    Attempted to contact family again this morning, phone rings no answering  machine.    Dispo: tbd     Angelita InglesWinfrey, Ronasia Isola B, MD 01/26/2018, 9:36 AM

## 2018-01-26 NOTE — Progress Notes (Signed)
ANTICOAGULATION CONSULT NOTE  Pharmacy Consult for Heparin  Indication: LV thrombus  No Known Allergies  Patient Measurements: Height: 5\' 9"  (175.3 cm) Weight: 190 lb 7.6 oz (86.4 kg) IBW/kg (Calculated) : 70.7  Vital Signs: Temp: 97.5 F (36.4 C) (12/08 2000) Temp Source: Oral (12/08 2000) BP: 100/72 (12/08 2100) Pulse Rate: 73 (12/08 2100)  Labs: Recent Labs     1149 01/25/18 0709 01/26/18 0151 01/26/18 0223 01/26/18 0951 01/26/18 1327 01/26/18 1628 01/26/18 2130  HGB 14.0 13.9 15.0  --   --   --   --   --   HCT 43.2 42.5 45.7  --   --   --   --   --   PLT 155 150 160  --   --   --   --   --   APTT 37*  --   --   --   --   --   --   --   LABPROT 23.3*  --   --   --   --   --   --   --   INR 2.10  --   --   --   --   --   --   --   HEPARINUNFRC  --   --   --   --   --  0.48  --  0.86*  CREATININE 1.97* 1.82* 1.92*  --  2.03*  --  2.17*  --   TROPONINI  --   --   --  0.08*  --   --   --   --     Estimated Creatinine Clearance: 31.5 mL/min (A) (by C-G formula based on SCr of 2.17 mg/dL (H)).   Medical History: Past Medical History:  Diagnosis Date  . Benign prostate hyperplasia   . Diabetes mellitus without complication (HCC)   . Dysphagia   . Hyperlipemia   . Hypertension   . Stroke [facility](HCC)     Assessment: 77 y/o M came in as a code stroke on 12/6, still undergoing stroke work-up, has a hx of stroke previously on Apixaban, but that was stopped about one month ago. Pt found to have LV thrombus on ECHO>>starting heparin. CBC good, noted renal dysfunction. INR was elevated at 2.10 on 12/6, but this is likely due to his liver dysfunction as AST and ALT are 803 and 762.   Next heparin level is elevated at 0.86.  Goal of Therapy:  Heparin level 0.3-0.5 units/ml, still undergoing stroke work-up Monitor platelets by anticoagulation protocol: Yes   Plan:  Decrease heparin gtt to 1,050 units/hr Monitor daily heparin level, CBC, s/s of bleed  Enzo BiNathan  Cranford Blessinger, PharmD, BCPS, [facility] Clinical Pharmacist Phone number [phone] 01/26/2018 10:21 PM

## 2018-01-26 NOTE — Progress Notes (Signed)
Sats progressively decreasing down to 82%  On 6LNC with good waveform on bedside monitor.  RT called.  100%  NRB placed on pt.  o2 sat 100%.  Lungs CTA.  Will continue to closely monitor.

## 2018-01-26 NOTE — Progress Notes (Signed)
ANTICOAGULATION CONSULT NOTE - Initial Consult  Pharmacy Consult for Heparin  Indication: LV thrombus  No Known Allergies  Patient Measurements: Height: 5\' 9"  (175.3 cm) Weight: 190 lb 7.6 oz (86.4 kg) IBW/kg (Calculated) : 70.7  Vital Signs: Temp: 99.6 F (37.6 C) (12/08 0110) Temp Source: Oral (12/08 0110) BP: 116/92 (12/08 0110) Pulse Rate: 152 (12/08 0110)  Labs: Recent Labs    02/09/2018 1149 01/25/18 0709 01/26/18 0151  HGB 14.0 13.9 15.0  HCT 43.2 42.5 45.7  PLT 155 150 160  APTT 37*  --   --   LABPROT 23.3*  --   --   INR 2.10  --   --   CREATININE 1.97* 1.82* 1.92*    Estimated Creatinine Clearance: 35.6 mL/min (A) (by C-G formula based on SCr of 1.92 mg/dL (H)).   Medical History: Past Medical History:  Diagnosis Date  . Benign prostate hyperplasia   . Diabetes mellitus without complication (HCC)   . Dysphagia   . Hyperlipemia   . Hypertension   . Stroke [facility](HCC)     Assessment: 77 y/o M came in as a code stroke on 12/6, still undergoing stroke work-up, has a hx of stroke previously on Apixaban, but that was stopped about one month ago. Pt found to have LV thrombus on ECHO>>starting heparin. CBC good, noted renal dysfunction. INR was elevated at 2.10 on 12/6, but this is likely due to his liver dysfunction as AST and ALT are 803 and 762.   Goal of Therapy:  Heparin level 0.3-0.5 units/ml, still undergoing stroke work-up Monitor platelets by anticoagulation protocol: Yes   Plan:  Start heparin drip at 1200 units/hr 1200 HL Daily CBC/HL Monitor for bleeding  Abran DukeLedford, Jayda White 01/26/2018,3:24 AM

## 2018-01-26 NOTE — Progress Notes (Signed)
Neurology Progress Note:  Interval History: Pt admitted from SNF as a code stroke. MRI neg for new stroke. 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 Heparin gtt started per cards. His mentation remains the same, some attempt to speak, but just mumbles and becomes agitated.   ROS: unable d/t agitation/confusion  Allergies No Known Allergies  Home Medications Medications Prior to Admission  Medication Sig Dispense Refill  . acyclovir (ZOVIRAX) 400 MG tablet Take 400 mg by mouth 2 (two) times daily.    Marland Kitchen allopurinol (ZYLOPRIM) 100 MG tablet Take 100 mg by mouth daily.    Marland Kitchen atorvastatin (LIPITOR) 40 MG tablet Take 40 mg by mouth at bedtime.    . clopidogrel (PLAVIX) 75 MG tablet Take 75 mg by mouth daily.    . furosemide (LASIX) 20 MG tablet Take 20 mg by mouth daily.     Marland Kitchen loratadine (CLARITIN) 10 MG tablet Take 10 mg by mouth daily.    . metFORMIN (GLUCOPHAGE) 500 MG tablet Take 250 mg by mouth 2 (two) times daily with a meal.     . metoprolol tartrate (LOPRESSOR) 25 MG tablet Take 25 mg by mouth 2 (two) times daily.    . Multiple Vitamin (MULTIVITAMIN WITH MINERALS) TABS tablet Take 1 tablet by mouth daily.    . OXYGEN Inhale 2 L into the lungs continuous.    . pregabalin (LYRICA) 25 MG capsule Take 25 mg by mouth 2 (two) times daily.    . sertraline (ZOLOFT) 25 MG tablet Take 25 mg by mouth daily. For anxiety    . tamsulosin (FLOMAX) 0.4 MG CAPS capsule Take 0.4 mg by mouth daily.      Hospital Medications . mouth rinse  15 mL Mouth Rinse BID     Objective  Intake/Output from previous day: 12/07 0701 - 12/08 0700 In: 3020.7 [I.V.:2520.7; IV Piggyback:500] Out: -  Intake/Output this shift: Total I/O In: -  Out: 1000 [Urine:1000] Nutritional status:  Diet Order            Diet NPO time specified  Diet effective now                Physical Exam -  Vitals:   01/26/18 1045 01/26/18 1100 01/26/18 1115 01/26/18 1130  BP: 97/74  104/75 129/68  Pulse: 74 80 72 78  Resp: (!) 21 10 (!) 22 (!) 32  Temp:      TempSrc:      SpO2: 99% 99% 99% 100%  Weight:      Height:       General - moderate distress Heart - tachy Lungs - Clear to auscultation; dyspnea Abdomen - Soft, but distended Extremities - Distal pulses intact - 1-2+LE edema Skin - Warm and dry  Neurologic Exam:   Mental Status: Alerts with repeat stimuli. Attempts to speak, but can't understand, just mumbles. DNFC, just becomes agitated with attempt to get him to participate Cranial Nerves: II: Discs not visualized; Visual fields grossly normal, blink to threat, pupils equal, round, reactive to light III,IV, VI: ptosis not present, extra-ocular motions intact bilaterally V,VII: smile slightly asymmetric on left, facial light touch sensation normal bilaterally VIII: hearing normal bilaterally IX,X: gag reflex present XI: absent on right shoulder shrug XII: midline tongue extension Motor: LUE - 5/5  RUE - 0/5   LLE - 5/5                                             RLE - 2/5 Tone increased in right; atrophy noted Sensory: Light touch intact throughout, bilaterally Deep Tendon Reflexes: mute LEs, 1+ UE Plantars: mute Cerebellar: Unable to test Gait: unable at baseline, uses w/c     LABORATORY RESULTS:  Basic Metabolic Panel: Recent Labs  Lab 01/20/2018 1116  02/02/2018 1149 01/25/18 0709 01/26/18 0151 01/26/18 0223 01/26/18 0951  NA 139  --  137 141 144  --  141  K 5.1  --  4.7 4.8 5.3*  --  4.7  CL 108  --  103 107 115*  --  113*  CO2  --   --  18* 19* 12*  --  17*  GLUCOSE 146*  --  152* 145* 165*  --  182*  BUN 53*  --  45* 50* 53*  --  50*  CREATININE 1.50*  --  1.97* 1.82* 1.92*  --  2.03*  CALCIUM  --    < > 8.6* 8.8* 8.5*  --  8.2*  MG  --   --   --   --   --  2.5*  --   PHOS  --    --   --   --   --  4.3  --    < > = values in this interval not displayed.    Liver Function Tests: Recent Labs  Lab 02/03/2018 1149 01/25/18 0709 01/26/18 0151  AST 259* 356* 803*  ALT 429* 496* 762*  ALKPHOS 123 134* 128*  BILITOT 2.2* 2.7* 3.2*  PROT 5.8* 6.1* 6.2*  ALBUMIN 2.8* 3.1* 3.1*   No results for input(s): LIPASE, AMYLASE in the last 168 hours. Recent Labs  Lab 01/30/2018 1703  AMMONIA 25    CBC: Recent Labs  Lab 02/05/2018 1116 02/03/2018 1149 01/25/18 0709 01/26/18 0151  WBC  --  8.3 8.1 8.8  NEUTROABS  --  5.1  --  5.5  HGB 16.7 14.0 13.9 15.0  HCT 49.0 43.2 42.5 45.7  MCV  --  93.7 90.2 91.4  PLT  --  155 150 160    Cardiac Enzymes: Recent Labs  Lab 01/26/18 0223  TROPONINI 0.08*    Lipid Panel: No results for input(s): CHOL, TRIG, HDL, CHOLHDL, VLDL, LDLCALC in the last 168 hours.  CBG: Recent Labs  Lab 02/18/2018 1150 01/26/18 1327  GLUCAP 139* 147*    Microbiology:   Coagulation Studies: Recent Labs    01/27/2018 1149  LABPROT 23.3*  INR 2.10    Miscellaneous Labs:   IMAGING RESULTS Reviewed: Dg Chest 1 View  Result Date: 01/26/2018 CLINICAL DATA:  Rapid breathing EXAM: CHEST  1 VIEW COMPARISON:  None. FINDINGS: Stable enlarged cardiac silhouette. No effusion, infiltrate pneumothorax. No acute osseous abnormality. IMPRESSION: Cardiomegaly without acute findings. Electronically Signed   By: Genevive Bi M.D.   On: 01/26/2018 02:23   Dg Abd 1 View  Result Date: 01/25/2018 CLINICAL DATA:  77 year old male. Evaluate for foreign object prior to MRI scan. EXAM: ABDOMEN - 1 VIEW COMPARISON:  None. FINDINGS: A transverse fixation screws through the bilateral SI joints and sacrum as well as additional left SI joint fixation screw. No other metallic or radiopaque foreign object identified. There is  no bowel dilatation or evidence of obstruction. Excreted contrast noted in the renal collecting system and within the bladder. There is  degenerative changes of the spine. Partially visualized left pleural effusion. IMPRESSION: Two fixation screws in the sacrum and SI joints. No additional radiopaque foreign object. Electronically Signed   By: Elgie Collard M.D.   On: 01/25/2018 02:48   Mr Maxine Glenn Head Wo Contrast  Result Date: 01/25/2018 CLINICAL DATA:  Initial evaluation for acute encephalopathy, stroke. EXAM: MRI HEAD WITHOUT CONTRAST MRA HEAD WITHOUT CONTRAST MRA NECK WITHOUT CONTRAST TECHNIQUE: Multiplanar, multiecho pulse sequences of the brain and surrounding structures were obtained without intravenous contrast. Angiographic images of the Circle of Willis were obtained using MRA technique without intravenous contrast. Angiographic images of the neck were obtained using MRA technique without intravenous contrast. Carotid stenosis measurements (when applicable) are obtained utilizing NASCET criteria, using the distal internal carotid diameter as the denominator. COMPARISON:  Prior CTs from 2018/02/11 FINDINGS: MRI HEAD FINDINGS Brain: Examination severely limited due to extensive motion artifact. Advanced parenchymal volume loss with mild to moderate chronic microvascular ischemic disease. Large chronic left MCA territory infarct with associated encephalomalacia and gliosis. Additional chronic bilateral cerebellar infarcts, right greater than left. No definite foci of restricted diffusion to suggest acute or subacute ischemia. Gray-white matter differentiation otherwise maintained. No definite foci of susceptibility artifact to suggest acute or chronic intracranial hemorrhage. No appreciable mass lesion on this motion degraded exam. No midline shift or mass effect. No hydrocephalus. No appreciable extra-axial fluid collection. Pituitary gland grossly normal. Vascular: Abnormal flow void within the left ICA to approximately the level of the terminus, suggesting slow flow and/or occlusion. Major intracranial vascular flow voids otherwise  grossly maintained at the skull base. Skull and upper cervical spine: Craniocervical junction within normal limits. No focal marrow replacing lesion. Scalp soft tissues unremarkable. Sinuses/Orbits: Globes and orbital soft tissues grossly within normal limits. Scattered mucosal thickening throughout the lobe paranasal sinuses. No air-fluid levels identified. Mastoid air cells clear. Other: None. MRA HEAD FINDINGS ANTERIOR CIRCULATION: Examination severely limited by extensive motion artifact. Absent flow related signal within the left ICA to the terminus, consistent with occlusion. Right ICA grossly patent to the terminus. Evaluation for stenosis limited due to extensive motion. A1 segments patent bilaterally. Hypoplastic left A1. Grossly patent and normal anterior communicating artery. Anterior cerebral arteries grossly patent to their mid aspects. M1 segments patent bilaterally without occlusion or appreciable stenosis. Normal MCA bifurcations. Right MCA branches perfused. Left MCA branches perfused as well though appear attenuated as compared to the right, likely related to chronic left MCA territory infarct. POSTERIOR CIRCULATION: Vertebral arteries grossly patent at the skull base. Posterior inferior cerebral arteries patent proximally. Basilar grossly patent to its distal aspect. No appreciable basilar stenosis on this motion degraded exam. Superior cerebral arteries patent proximally. Both of the PCAs primarily supplied via the basilar, although small bilateral posterior communicating arteries noted. PCAs grossly patent to their distal aspects. Possible short-segment severe right P2 stenosis noted (series 1052, image 15). MRA NECK FINDINGS Examination is essentially nondiagnostic due to extensive motion artifact. Right common and internal carotid arteries grossly patent. Some flow related signal seen within the left CCA, although the left ICA not well seen and may be occluded. Partially visualized vertebral  arteries patent with antegrade flow. Right vertebral artery appears dominant. Examination inadequate to evaluate for stenoses. IMPRESSION: MRI HEAD IMPRESSION: 1. Severely limited study due to extensive motion artifact. 2. No definite acute intracranial infarct or other abnormality identified.  3. 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 MRA HEAD IMPRESSION: 1. Severely limited exam due to extensive motion artifact. 2. Chronic left ICA occlusion. Distal reconstitution of the left MCA and ACA via flow across the circle-of-Willis. 3. Remainder of the major arterial vasculature of the head grossly patent without obvious abnormality. MRA NECK IMPRESSION: 1. Essentially nondiagnostic MRA of the neck due to severe motion artifact. 2. Grossly patent right carotid artery system and vertebral arteries. Partially visualized left CCA patent. Left ICA not visualized, and may be occluded within the neck. Electronically Signed   By: Rise Mu M.D.   On: 01/25/2018 05:59   Mr Maxine Glenn Neck Wo Contrast  Result Date: 01/25/2018 CLINICAL DATA:  Initial evaluation for acute encephalopathy, stroke. EXAM: MRI HEAD WITHOUT CONTRAST MRA HEAD WITHOUT CONTRAST MRA NECK WITHOUT CONTRAST TECHNIQUE: Multiplanar, multiecho pulse sequences of the brain and surrounding structures were obtained without intravenous contrast. Angiographic images of the Circle of Willis were obtained using MRA technique without intravenous contrast. Angiographic images of the neck were obtained using MRA technique without intravenous contrast. Carotid stenosis measurements (when applicable) are obtained utilizing NASCET criteria, using the distal internal carotid diameter as the denominator. COMPARISON:  Prior CTs from 02/09/2018 FINDINGS: MRI HEAD FINDINGS Brain: Examination severely limited due to extensive motion artifact. Advanced parenchymal volume loss with mild to  moderate chronic microvascular ischemic disease. Large chronic left MCA territory infarct with associated encephalomalacia and gliosis. Additional chronic bilateral cerebellar infarcts, right greater than left. No definite foci of restricted diffusion to suggest acute or subacute ischemia. Gray-white matter differentiation otherwise maintained. No definite foci of susceptibility artifact to suggest acute or chronic intracranial hemorrhage. No appreciable mass lesion on this motion degraded exam. No midline shift or mass effect. No hydrocephalus. No appreciable extra-axial fluid collection. Pituitary gland grossly normal. Vascular: Abnormal flow void within the left ICA to approximately the level of the terminus, suggesting slow flow and/or occlusion. Major intracranial vascular flow voids otherwise grossly maintained at the skull base. Skull and upper cervical spine: Craniocervical junction within normal limits. No focal marrow replacing lesion. Scalp soft tissues unremarkable. Sinuses/Orbits: Globes and orbital soft tissues grossly within normal limits. Scattered mucosal thickening throughout the lobe paranasal sinuses. No air-fluid levels identified. Mastoid air cells clear. Other: None. MRA HEAD FINDINGS ANTERIOR CIRCULATION: Examination severely limited by extensive motion artifact. Absent flow related signal within the left ICA to the terminus, consistent with occlusion. Right ICA grossly patent to the terminus. Evaluation for stenosis limited due to extensive motion. A1 segments patent bilaterally. Hypoplastic left A1. Grossly patent and normal anterior communicating artery. Anterior cerebral arteries grossly patent to their mid aspects. M1 segments patent bilaterally without occlusion or appreciable stenosis. Normal MCA bifurcations. Right MCA branches perfused. Left MCA branches perfused as well though appear attenuated as compared to the right, likely related to chronic left MCA territory infarct. POSTERIOR  CIRCULATION: Vertebral arteries grossly patent at the skull base. Posterior inferior cerebral arteries patent proximally. Basilar grossly patent to its distal aspect. No appreciable basilar stenosis on this motion degraded exam. Superior cerebral arteries patent proximally. Both of the PCAs primarily supplied via the basilar, although small bilateral posterior communicating arteries noted. PCAs grossly patent to their distal aspects. Possible short-segment severe right P2 stenosis noted (series 1052, image 15). MRA NECK FINDINGS Examination is essentially nondiagnostic due to extensive motion artifact. Right common and internal carotid arteries grossly patent. Some flow related signal seen  within the left CCA, although the left ICA not well seen and may be occluded. Partially visualized vertebral arteries patent with antegrade flow. Right vertebral artery appears dominant. Examination inadequate to evaluate for stenoses. IMPRESSION: MRI HEAD IMPRESSION: 1. Severely limited study due to extensive motion artifact. 2. No definite acute intracranial infarct or other abnormality identified. 3. 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 MRA HEAD IMPRESSION: 1. Severely limited exam due to extensive motion artifact. 2. Chronic left ICA occlusion. Distal reconstitution of the left MCA and ACA via flow across the circle-of-Willis. 3. Remainder of the major arterial vasculature of the head grossly patent without obvious abnormality. MRA NECK IMPRESSION: 1. Essentially nondiagnostic MRA of the neck due to severe motion artifact. 2. Grossly patent right carotid artery system and vertebral arteries. Partially visualized left CCA patent. Left ICA not visualized, and may be occluded within the neck. Electronically Signed   By: Rise MuBenjamin  McClintock M.D.   On: 01/25/2018 05:59   Mr Brain Wo Contrast  Result Date: 01/25/2018 CLINICAL DATA:   Initial evaluation for acute encephalopathy, stroke. EXAM: MRI HEAD WITHOUT CONTRAST MRA HEAD WITHOUT CONTRAST MRA NECK WITHOUT CONTRAST TECHNIQUE: Multiplanar, multiecho pulse sequences of the brain and surrounding structures were obtained without intravenous contrast. Angiographic images of the Circle of Willis were obtained using MRA technique without intravenous contrast. Angiographic images of the neck were obtained using MRA technique without intravenous contrast. Carotid stenosis measurements (when applicable) are obtained utilizing NASCET criteria, using the distal internal carotid diameter as the denominator. COMPARISON:  Prior CTs from 01/31/2018 FINDINGS: MRI HEAD FINDINGS Brain: Examination severely limited due to extensive motion artifact. Advanced parenchymal volume loss with mild to moderate chronic microvascular ischemic disease. Large chronic left MCA territory infarct with associated encephalomalacia and gliosis. Additional chronic bilateral cerebellar infarcts, right greater than left. No definite foci of restricted diffusion to suggest acute or subacute ischemia. Gray-white matter differentiation otherwise maintained. No definite foci of susceptibility artifact to suggest acute or chronic intracranial hemorrhage. No appreciable mass lesion on this motion degraded exam. No midline shift or mass effect. No hydrocephalus. No appreciable extra-axial fluid collection. Pituitary gland grossly normal. Vascular: Abnormal flow void within the left ICA to approximately the level of the terminus, suggesting slow flow and/or occlusion. Major intracranial vascular flow voids otherwise grossly maintained at the skull base. Skull and upper cervical spine: Craniocervical junction within normal limits. No focal marrow replacing lesion. Scalp soft tissues unremarkable. Sinuses/Orbits: Globes and orbital soft tissues grossly within normal limits. Scattered mucosal thickening throughout the lobe paranasal sinuses.  No air-fluid levels identified. Mastoid air cells clear. Other: None. MRA HEAD FINDINGS ANTERIOR CIRCULATION: Examination severely limited by extensive motion artifact. Absent flow related signal within the left ICA to the terminus, consistent with occlusion. Right ICA grossly patent to the terminus. Evaluation for stenosis limited due to extensive motion. A1 segments patent bilaterally. Hypoplastic left A1. Grossly patent and normal anterior communicating artery. Anterior cerebral arteries grossly patent to their mid aspects. M1 segments patent bilaterally without occlusion or appreciable stenosis. Normal MCA bifurcations. Right MCA branches perfused. Left MCA branches perfused as well though appear attenuated as compared to the right, likely related to chronic left MCA territory infarct. POSTERIOR CIRCULATION: Vertebral arteries grossly patent at the skull base. Posterior inferior cerebral arteries patent proximally. Basilar grossly patent to its distal aspect. No appreciable basilar stenosis on this motion degraded exam. Superior cerebral arteries patent proximally. Both of the  PCAs primarily supplied via the basilar, although small bilateral posterior communicating arteries noted. PCAs grossly patent to their distal aspects. Possible short-segment severe right P2 stenosis noted (series 1052, image 15). MRA NECK FINDINGS Examination is essentially nondiagnostic due to extensive motion artifact. Right common and internal carotid arteries grossly patent. Some flow related signal seen within the left CCA, although the left ICA not well seen and may be occluded. Partially visualized vertebral arteries patent with antegrade flow. Right vertebral artery appears dominant. Examination inadequate to evaluate for stenoses. IMPRESSION: MRI HEAD IMPRESSION: 1. Severely limited study due to extensive motion artifact. 2. No definite acute intracranial infarct or other abnormality identified. 3. Large chronic left MCA territory  infarct with additional chronic bilateral cerebellar infarcts. 4. Chronic left ICA occlusion. MRA HEAD IMPRESSION: 1. Severely limited exam due to extensive motion artifact. 2. Chronic left ICA occlusion. Distal reconstitution of the left MCA and ACA via flow across the circle-of-Willis. 3. Remainder of the major arterial vasculature of the head grossly patent without obvious abnormality. MRA NECK IMPRESSION: 1. Essentially nondiagnostic MRA of the neck due to severe motion artifact. 2. Grossly patent right carotid artery system and vertebral arteries. Partially visualized left CCA patent. Left ICA not visualized, and may be occluded within the neck. Electronically Signed   By: Rise Mu M.D.   On: 01/25/2018 05:59   US Abdomen Complete  Result Date: 01/25/2018 CLINICAL DATA:  Elevated bilirubin and transaminitis. EXAM: ABDOMEN ULTRASOUND COMPLETE COMPARISON:  CT 08/02/2012 FINDINGS: Gallbladder: Mild gallbladder wall thickening. No visible stones. Some gallbladder sludge. Question Murphy sign, difficult to assess given the patient's clinical condition. Cholecystitis is not excluded. Common bile duct: Diameter: 4 mm, normal. Liver: Increased echogenicity of the liver parenchyma suggesting fatty change. No focal liver lesions seen. Portal vein is patent on color Doppler imaging with normal direction of blood flow towards the liver. IVC: No abnormality visualized. Pancreas: Not seen because of overlying bowel gas. Spleen: Size and appearance within normal limits. Right Kidney: Length: 14.6 cm. Multiple cysts, the largest measuring 5 cm in diameter. Upper pole location accounts for the enlarged renal length. Left Kidney: Length: 11.5 cm. 1.5 cm simple cyst. Echogenicity within normal limits. No mass or hydronephrosis visualized. Abdominal aorta: Poorly seen because of overlying bowel gas. Other findings: Ascites noted surrounding the liver. IMPRESSION: Abnormal appearing gallbladder. Wall thickening.  Sludge. Question Murphy sign. No visible stones. Consider nuclear medicine study, as cholecystitis is not excluded given this constellation of findings. Small amount of perihepatic ascites. Renal cysts. Electronically Signed   By: Paulina Fusi M.D.   On: 01/25/2018 10:05   Dg Hip Unilat With Pelvis 1v Right  Result Date: 01/25/2018 CLINICAL DATA:  R/o foreign body for mri EXAM: DG HIP (WITH OR WITHOUT PELVIS) 1V RIGHT COMPARISON:  None. FINDINGS: Cannulated screw spans the sacrum. RIGHT hip is located. Remote fracture of the inferior pubic ramus IMPRESSION: No acute fracture dislocation. Cannulated screw spans the SI joints Electronically Signed   By: Genevive Bi M.D.   On: 01/25/2018 02:50   EEG: abnormal due to L focal slowing and muscle artifact. There were no electrographic seizures in this study.  Assessment/Plan:  Pt admitted from SNF for AMS. MRI neg for new stroke. 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 EEG neg for seizures. He has been restless and agitated. He continues to have dyspnea and now SVT in setting of cardiogenic shock.   # Toxic metabolic encephalopathy- EEG did not show  seizures of status. MRI did not show new structural issues/no new stroke. Extensive previous strokes would make for prolonged encephalopathy and poor neurologic reserve for a pt with any acute critical illness, let alone hepatic or renal problems. LFTs are quite elevated, baseline unknown. NH4 is 25. AKI also note which can be contributing factor in setting of cardiogenic shock, this is likely a cardiorenal  syndrome.  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 Will need rehab efforts as tol. High risk for delirium  # Cardiogenic shock- Now with SVT and LV thrombus noted. Heparin gtt started. He is a risk for further stroke.   Recommendations: Supportive care; Goals of care need to be discussed with family Agree with Heparin gtt He is at risk  for further stroke Metabolic dysfunction can be corrected, but would not expect his mentation to improve quickly as brain lags behind. Once shock is resolved and all derangements corrected, would still expect encephalopathy to slowly resolve, his mentation may not return to his demented baseline after this critical illness. The brain lags behind any corrections to the metabolic dysfunction, would expect a very slow recovery in mentation, especially given the degree of underlying stroke damage and dementia. He has a poor neurologic reserve.  We will sign off for now. Please call back if needed or if there is further neurologic decline

## 2018-01-26 NOTE — Progress Notes (Signed)
Attempted to call wife Les PouShirlie at [phone] to notify her of husbands transfer to [facility].  No one answered and answering machine was not set up.

## 2018-01-26 NOTE — Consult Note (Signed)
NAME:  Oscar Salazar, MRN:  [id number], DOB:  [birth year], LOS: 2 ADMISSION DATE:  02/08/2018, CONSULTATION DATE:  01/26/2018 REFERRING MD: , CHIEF COMPLAINT:  Lactic acidosis  Brief History/History of Presenting Illness   Oscar Salazar is a 77 y.o. 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 He was being worked up for possible seizures. His admission labs were concerning elevated LFTs and elevated creatinine. His echo done yesterday showed EF 10-15%, diffuse hypokinesia, and there was a thrombus noted in the left ventricle. He was started on heparin gtt  Early this morning he had an episode of narrow complex tachycardia. He was given a dose of metoprolol with resolution of tachycardia. Subsequently he developed hypotension and was given about 350cc fluid bolus with resolution of hypotension. Lactic acid was checked and was found to be elevated at 8.7. He was also noted to not have any significant urine output Cardiology was consulted and are concerned about cardiogenic shock. Given 80mg  of lasix and started on lasix gtt now with urine output.     Past Medical History  -Heart failure with reduced EF -Type II Diabetes  -Hyperlipidemia -CVA -Hyperlipidemia   Significant Hospital Events   -Transfer to ICU  Consults:  -Cardiology   Procedures:  -Peripheral IVs  Significant Diagnostic Tests:  -LFTs   Micro Data:  -None   Antimicrobials:  -None   Objective   Blood pressure 114/80, pulse 63, temperature 99.6 F (37.6 C), temperature source Oral, resp. rate (!) 34, height 5\' 9"  (1.753 m), weight 86.4 kg, SpO2 100 %.        Intake/Output Summary (Last 24 hours) at 01/26/2018 0659 Last data filed at  01/26/2018 0615 Gross per 24 hour  Intake 3020.66 ml  Output -  Net 3020.66 ml   Filed Weights   01/20/2018 1100 02/11/2018 1220  Weight: 86.4 kg 86.4 kg   Examination: General: laying in bed with eyes closed, obeys simple commands, mumbles HENT:  Lungs: CTAB Cardiovascular: Regular rate and rhythm, no murmurs Abdomen: Distended, soft, no organomegaly Extremities: Mild LE edema Neuro: altered mental status, laying in bed, has mittens on, moving all extremites GU: has foley catheter   Assessment & Plan:  #Cardiogenic shock #Acute on chronic systolic heart failure With low output state.  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 Complete abdominal ultrasound showed normal appearing gallbladder with wall thickening. Hepatitis panel negative -Diuresis as above -daily LFTs -HIDA scan has been ordered  #Acute encephalopathy Suspect related to low output state. MRI and MRA done yesterday were limited due to motion. Large chronic left MCA territory infarct with chronic bilateral cerebellar infarcts noted -Frequent reorientation -Hopefully patient's mental status will improve with management of heart failure -Frequent neuro checks  Best practice:  Diet: NPO Pain/Anxiety/Delirium protocol (if indicated): Acetaminophen VAP protocol (if indicated): Not indicated  DVT prophylaxis: On heparin gtt GI prophylaxis: Not indicated  Glucose control: maintain euglycemia  Mobility:  Code Status: Full code  Family Communication: No family at bedside Disposition: Transfer to ICU  Labs   CBC: Recent  Labs  Lab 2018/02/15 1116 2018/02/15 1149 01/25/18 0709 01/26/18 0151  WBC  --  8.3 8.1 8.8  NEUTROABS  --  5.1  --  5.5  HGB 16.7 14.0 13.9 15.0  HCT 49.0 43.2 42.5 45.7  MCV  --  93.7 90.2 91.4  PLT  --  155 150 160    Basic Metabolic Panel: Recent Labs  Lab 2018/02/15 1116 2018/02/15 1149 01/25/18 0709 01/26/18 0151 01/26/18 0223  NA 139 137 141 144  --   K 5.1 4.7 4.8 5.3*  --   CL 108 103 107 115*  --   CO2  --  18* 19* 12*  --   GLUCOSE 146* 152* 145* 165*  --   BUN 53* 45* 50* 53*  --   CREATININE 1.50* 1.97* 1.82* 1.92*  --   CALCIUM  --  8.6* 8.8* 8.5*  --   MG  --   --   --   --  2.5*  PHOS  --   --   --   --  4.3   GFR: Estimated Creatinine Clearance: 35.6 mL/min (A) (by C-G formula based on SCr of 1.92 mg/dL (H)). Recent Labs  Lab 2018/02/15 1149 01/25/18 0709 01/26/18 0151 01/26/18 0430  WBC 8.3 8.1 8.8  --   LATICACIDVEN  --   --   --  8.7*    Liver Function Tests: Recent Labs  Lab 2018/02/15 1149 01/25/18 0709 01/26/18 0151  AST 259* 356* 803*  ALT 429* 496* 762*  ALKPHOS 123 134* 128*  BILITOT 2.2* 2.7* 3.2*  PROT 5.8* 6.1* 6.2*  ALBUMIN 2.8* 3.1* 3.1*   No results for input(s): LIPASE, AMYLASE in the last 168 hours. Recent Labs  Lab 2018/02/15 1703  AMMONIA 25    ABG    Component Value Date/Time   PHART 7.267 (L) 01/26/2018 0340   PCO2ART 24.4 (L) 01/26/2018 0340   PO2ART 107 01/26/2018 0340   HCO3 10.7 (L) 01/26/2018 0340   TCO2 17 (L) 08/26/17 1859   ACIDBASEDEF 14.9 (H) 01/26/2018 0340   O2SAT 96.3 01/26/2018 0340     Coagulation Profile: Recent Labs  Lab 2018/02/15 1149  INR 2.10    Cardiac Enzymes: Recent Labs  Lab 01/26/18 0223  TROPONINI 0.08*    HbA1C: No results found for: HGBA1C  CBG: Recent Labs  Lab 2018/02/15 1150  GLUCAP 139*    Review of Systems:   Unable to obtain review of system due to altered mental status   Past Medical History  He,  has a past medical history of Benign prostate hyperplasia,  Diabetes mellitus without complication (HCC), Dysphagia, Hyperlipemia, Hypertension, and Stroke (HCC).   Surgical History   History reviewed. No pertinent surgical history.   Social History   reports that he has been smoking cigarettes. He has been smoking about 0.50 packs per day. He does not have any smokeless tobacco history on file. He reports that he does not drink alcohol or use drugs.   Family History   His family history is not on file.   Allergies No Known Allergies   Home Medications  Prior to Admission medications   Medication Sig Start Date End Date Taking? Authorizing Provider  acyclovir (ZOVIRAX) 400 MG tablet Take 400 mg by mouth 2 (two) times daily. 04/01/17  Yes [provider]  allopurinol (ZYLOPRIM) 100 MG  tablet Take 100 mg by mouth daily. 04/01/17  Yes [provider]  atorvastatin (LIPITOR) 40 MG tablet Take 40 mg by mouth at bedtime. 04/01/17  Yes [provider]  clopidogrel (PLAVIX) 75 MG tablet Take 75 mg by mouth daily. 04/01/17  Yes [provider]  furosemide (LASIX) 20 MG tablet Take 20 mg by mouth daily.  04/01/17  Yes [provider]  loratadine (CLARITIN) 10 MG tablet Take 10 mg by mouth daily.   Yes [provider]  metFORMIN (GLUCOPHAGE) 500 MG tablet Take 250 mg by mouth 2 (two) times daily with a meal.  04/02/17  Yes [provider]  metoprolol tartrate (LOPRESSOR) 25 MG tablet Take 25 mg by mouth 2 (two) times daily. 04/01/17  Yes [provider]  Multiple Vitamin (MULTIVITAMIN WITH MINERALS) TABS tablet Take 1 tablet by mouth daily.   Yes [provider]  OXYGEN Inhale 2 L into the lungs continuous.   Yes [provider]  pregabalin (LYRICA) 25 MG capsule Take 25 mg by mouth 2 (two) times daily.   Yes [provider]  sertraline (ZOLOFT) 25 MG tablet Take 25 mg by mouth daily. For anxiety   Yes [provider]  tamsulosin (FLOMAX) 0.4 MG CAPS capsule  Take 0.4 mg by mouth daily. 04/01/17  Yes [provider]     Critical care time: The patient is critically ill with multiple organ systems failure and requires high complexity decision making for assessment and support, frequent evaluation and titration of therapies, application of advanced monitoring technologies and extensive interpretation of multiple databases.   Critical Care Time devoted to patient care services described in this note is  60 Minutes. This time reflects time of care of this signee Dr. Larna Daughters. This critical care time does not reflect procedure time, or teaching time or supervisory time of PA/NP/Med student/Med Resident etc but could involve care discussion time.  Larna Daughters, M.D. [facility]/Critical Care Medicine. Pager:  After hours pager: [phone].

## 2018-01-26 NOTE — Progress Notes (Signed)
Dr. Tonia BroomsIcard, CCM notified of lactic acid of 8.0, increase in RR and becoming more labored.  Extremities becoming dusky and cold.  BLE pulses, pedal and post tib, sluggish via doppler.   [facility] [place].  Spoke with nurse there and obtained next of kin information.  Obtained contact information of his sister Barron AlvineSade [phone].  His sister stated pt's brother [name] makes medical decisions for the patient.  I phoned [name] and updated him on pt's status.  Dr. Tonia BroomsIcard spoke with [name] as well.  Dr. Tonia BroomsIcard and myself confirmed with [name] that pt is a full DNR.  [name] stated that pt and his son have been estranged for "years."   For all medical decisions and updates, [name] should be notified at [phone].   Will continue to closely monitor.

## 2018-01-26 NOTE — Progress Notes (Signed)
Interim Progress Note   Cardiogenic shock 10:30 am Lactate is trending down.  04:30>> 8.7 mmol/L 07:30 >> 4.9 mmol.L 10:30 >> 3.6 mmol.L  Will reassess after 13:30 draw  If continues to downtrend, will not need milrinone added.   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 Normajean Salazar, AGACNP-BC [facility]/Critical Care Medicine Pager # [phone] 01/26/2018

## 2018-01-26 NOTE — Progress Notes (Signed)
CRITICAL VALUE ALERT  Critical Value:  Lactic acid 4.8   Date & Time Notied:  01/26/2018 @ 1415  Provider Notified: Carrie MewKaty Whitehart NP  Orders Received/Actions taken: No new orders at this time.  NP to review other labs and will make new orders if needed.

## 2018-01-26 NOTE — Progress Notes (Signed)
RT came to check on pt after RN called about desaturation.  Pt was on 100%NRB and sats 100% on monitor.  No distress noted, but some abdominal breathing.  BBS clear and diminished.   RN stated she had called NP.  RT will continue to monitor for further orders.

## 2018-01-26 NOTE — Progress Notes (Signed)
Central telemetry notified me via phone that patient was in SVT rate 140-180.  I went in to assess patient and he was in bed with his normal restless behavior.  I immediately paged the on call MD Hermine MessickMarissa Krienke and state she was coming to assessed the patient.  A full set of vitals signs was obtained and an EKG.  There was discussion of giving adenosine when the doctor arrived at the bedside so I pulled it from the pyxis in preparation.  The doctor subsequently changed her mind and ordered Metoprolol 5mg  IVP and it was given as ordered.  The metoprolol decreased the heart rate to the 90's after a few minutes also brining down his BP.  I notified Dr. Obie DredgeBlum that was outside the patient's room charting.  NS bolus was ordered and carried out.  BP returned to WNL after about 350 ml of NS bolus.  Dr. Obie DredgeBlum notified that patient had not voided on my shift since 7pm as well.  Patient now in bed with respiratory rate in the 40's and BP and HR are stable.  Charge RN notified rapid response RN to come assess patient after the IMTS MD left the bedside.  Heparin drip was initiated as ordered by pharmacy protocol.  Rapid response RN remains at the bedside for further orders from IMTS MD.  See Notes by Dr. Obie DredgeBlum and rapid response RN for further details. Nursing staff to continue to monitor.

## 2018-01-26 NOTE — Progress Notes (Signed)
Progress Note  Patient Name: Oscar Salazar Date of Encounter: 01/26/2018  Primary Cardiologist: Chilton Si, MD (new)  Subjective   Unable to obtain.  Patient somnolent and not responsive.  Patient transferred to ICU due to cardiogenic shock.  Inpatient Medications    Scheduled Meds: . mouth rinse  15 mL Mouth Rinse BID   Continuous Infusions: . furosemide (LASIX) infusion 10 mg/hr (01/26/18 0602)  . heparin 1,200 Units/hr (01/26/18 0406)   PRN Meds: iopamidol, metoprolol tartrate   Vital Signs    Vitals:   01/26/18 0600 01/26/18 0715 01/26/18 0730 01/26/18 0745  BP: 114/80 101/70 (!) 116/98   Pulse: 63  97   Resp: (!) 34 (!) 34 18   Temp:      TempSrc:      SpO2: 100%  100% 100%  Weight:      Height:        Intake/Output Summary (Last 24 hours) at 01/26/2018 1012 Last data filed at 01/26/2018 0858 Gross per 24 hour  Intake 3020.66 ml  Output 725 ml  Net 2295.66 ml   Filed Weights   02/10/2018 1100 02/02/2018 1220  Weight: 86.4 kg 86.4 kg    Telemetry    Currently in SVT.  Rate 150.- Personally Reviewed  ECG    01/26/2018: Sinus rhythm.  First-degree AV block.  Rate 94 bpm.- Personally Reviewed  Physical Exam  VS:  BP (!) 116/98   Pulse 97   Temp 99.6 F (37.6 C) (Oral)   Resp 18   Ht 5\' 9"  (1.753 m)   Wt 86.4 kg   SpO2 100%   BMI 28.13 kg/m  , BMI Body mass index is 28.13 kg/m. GENERAL:  Critically ill-appearing HEENT: Pupils equal round and reactive, fundi not visualized, oral mucosa unremarkable NECK:  No jugular venous distention, waveform within normal limits, carotid upstroke brisk and symmetric, no bruits, no thyromegaly LUNGS:  Clear to auscultation bilaterally on anterior exam HEART:  Tachycardic.  Regular rhythm.  PMI not displaced or sustained,S1 and S2 within normal limits, no S3, no S4, no clicks, no rubs, no murmurs ABD:  Flat, positive bowel sounds normal in frequency in pitch, no bruits, no rebound, no guarding, no midline  pulsatile mass, no hepatomegaly, no splenomegaly EXT:  2 plus pulses throughout, no edema, no cyanosis no clubbing SKIN:  No rashes no nodules NEURO:  Moves all 4 extremities freely.  Agitated and groaning PSYCH:  Unable to assess   Labs    Chemistry Recent Labs  Lab 02/10/2018 1149 01/25/18 0709 01/26/18 0151  NA 137 141 144  K 4.7 4.8 5.3*  CL 103 107 115*  CO2 18* 19* 12*  GLUCOSE 152* 145* 165*  BUN 45* 50* 53*  CREATININE 1.97* 1.82* 1.92*  CALCIUM 8.6* 8.8* 8.5*  PROT 5.8* 6.1* 6.2*  ALBUMIN 2.8* 3.1* 3.1*  AST 259* 356* 803*  ALT 429* 496* 762*  ALKPHOS 123 134* 128*  BILITOT 2.2* 2.7* 3.2*  GFRNONAA 32* 35* 33*  GFRAA 37* 41* 38*  ANIONGAP 16* 15 17*     Hematology Recent Labs  Lab 02/09/2018 1149 01/25/18 0709 01/26/18 0151  WBC 8.3 8.1 8.8  RBC 4.61 4.71 5.00  HGB 14.0 13.9 15.0  HCT 43.2 42.5 45.7  MCV 93.7 90.2 91.4  MCH 30.4 29.5 30.0  MCHC 32.4 32.7 32.8  RDW 17.0* 16.8* 17.0*  PLT 155 150 160    Cardiac Enzymes Recent Labs  Lab 01/26/18 0223  TROPONINI 0.08*  Recent Labs  Lab 02/07/2018 1114  TROPIPOC 0.01     BNPNo results for input(s): BNP, PROBNP in the last 168 hours.   DDimer No results for input(s): DDIMER in the last 168 hours.   Radiology    Ct Angio Head W Or Wo Contrast  Result Date: 02/10/2018 CLINICAL DATA:  Right-sided weakness. EXAM: CT ANGIOGRAPHY HEAD AND NECK TECHNIQUE: Multidetector CT imaging of the head and neck was performed using the standard protocol during bolus administration of intravenous contrast. Multiplanar CT image reconstructions and MIPs were obtained to evaluate the vascular anatomy. Carotid stenosis measurements (when applicable) are obtained utilizing NASCET criteria, using the distal internal carotid diameter as the denominator. CONTRAST:  75mL ISOVUE-370 IOPAMIDOL (ISOVUE-370) INJECTION 76% COMPARISON:  Carotid Doppler ultrasound 05/28/2012 FINDINGS: CTA NECK FINDINGS Due to respiratory motion  there was incorrect bolus timing with only venous contrast present including refluxed contrast in the right internal jugular vein and multiple right upper chest venous collaterals. No arterial contrast is present in the head or neck and the study is nondiagnostic for assessment of the arterial structures. Skeleton: Severe diffuse cervical disc degeneration with moderate multilevel neural foraminal stenosis. Other neck: Breathing motion artifact without evidence of acute abnormality or mass. Scattered venous gas, likely iatrogenic. Upper chest: Aortic atherosclerosis. Mild centrilobular emphysema. No apical lung consolidation. Review of the MIP images confirms the above findings CTA HEAD FINDINGS Nondiagnostic evaluation of the intracranial vasculature. Calcified atherosclerosis of the intracranial vertebral and internal carotid arteries with chronic calcified plaque or calcified embolus in the left M1 segment. Review of the MIP images confirms the above findings IMPRESSION: 1. Nondiagnostic study due to motion artifact resulting in incorrect bolus timing. 2. Aortic Atherosclerosis (ICD10-I70.0) and Emphysema (ICD10-J43.9). These results were called by telephone at the time of interpretation on 02/14/2018 at 11:45 pm to Dr. Danise Edgeesiree Metzger-Chelka, who verbally acknowledged these results. Electronically Signed   By: Sebastian AcheAllen  Grady M.D.   On: 01/21/2018 12:08   Dg Chest 1 View  Result Date: 01/26/2018 CLINICAL DATA:  Rapid breathing EXAM: CHEST  1 VIEW COMPARISON:  None. FINDINGS: Stable enlarged cardiac silhouette. No effusion, infiltrate pneumothorax. No acute osseous abnormality. IMPRESSION: Cardiomegaly without acute findings. Electronically Signed   By: Genevive BiStewart  Edmunds M.D.   On: 01/26/2018 02:23   Dg Abd 1 View  Result Date: 01/25/2018 CLINICAL DATA:  77 year old male. Evaluate for foreign object prior to MRI scan. EXAM: ABDOMEN - 1 VIEW COMPARISON:  None. FINDINGS: A transverse fixation screws through the  bilateral SI joints and sacrum as well as additional left SI joint fixation screw. No other metallic or radiopaque foreign object identified. There is no bowel dilatation or evidence of obstruction. Excreted contrast noted in the renal collecting system and within the bladder. There is degenerative changes of the spine. Partially visualized left pleural effusion. IMPRESSION: Two fixation screws in the sacrum and SI joints. No additional radiopaque foreign object. Electronically Signed   By: Elgie CollardArash  Radparvar M.D.   On: 01/25/2018 02:48   Ct Angio Neck W Or Wo Contrast  Result Date: 01/31/2018 CLINICAL DATA:  Right-sided weakness. EXAM: CT ANGIOGRAPHY HEAD AND NECK TECHNIQUE: Multidetector CT imaging of the head and neck was performed using the standard protocol during bolus administration of intravenous contrast. Multiplanar CT image reconstructions and MIPs were obtained to evaluate the vascular anatomy. Carotid stenosis measurements (when applicable) are obtained utilizing NASCET criteria, using the distal internal carotid diameter as the denominator. CONTRAST:  75mL ISOVUE-370 IOPAMIDOL (ISOVUE-370) INJECTION 76% COMPARISON:  Carotid Doppler ultrasound 05/28/2012 FINDINGS: CTA NECK FINDINGS Due to respiratory motion there was incorrect bolus timing with only venous contrast present including refluxed contrast in the right internal jugular vein and multiple right upper chest venous collaterals. No arterial contrast is present in the head or neck and the study is nondiagnostic for assessment of the arterial structures. Skeleton: Severe diffuse cervical disc degeneration with moderate multilevel neural foraminal stenosis. Other neck: Breathing motion artifact without evidence of acute abnormality or mass. Scattered venous gas, likely iatrogenic. Upper chest: Aortic atherosclerosis. Mild centrilobular emphysema. No apical lung consolidation. Review of the MIP images confirms the above findings CTA HEAD FINDINGS  Nondiagnostic evaluation of the intracranial vasculature. Calcified atherosclerosis of the intracranial vertebral and internal carotid arteries with chronic calcified plaque or calcified embolus in the left M1 segment. Review of the MIP images confirms the above findings IMPRESSION: 1. Nondiagnostic study due to motion artifact resulting in incorrect bolus timing. 2. Aortic Atherosclerosis (ICD10-I70.0) and Emphysema (ICD10-J43.9). These results were called by telephone at the time of interpretation on 02/18/2018 at 11:45 pm to Dr. Danise Edge, who verbally acknowledged these results. Electronically Signed   By: Sebastian Ache M.D.   On: 02/10/2018 12:08   Mr Maxine Glenn Head Wo Contrast  Result Date: 01/25/2018 CLINICAL DATA:  Initial evaluation for acute encephalopathy, stroke. EXAM: MRI HEAD WITHOUT CONTRAST MRA HEAD WITHOUT CONTRAST MRA NECK WITHOUT CONTRAST TECHNIQUE: Multiplanar, multiecho pulse sequences of the brain and surrounding structures were obtained without intravenous contrast. Angiographic images of the Circle of Willis were obtained using MRA technique without intravenous contrast. Angiographic images of the neck were obtained using MRA technique without intravenous contrast. Carotid stenosis measurements (when applicable) are obtained utilizing NASCET criteria, using the distal internal carotid diameter as the denominator. COMPARISON:  Prior CTs from 02/06/2018 FINDINGS: MRI HEAD FINDINGS Brain: Examination severely limited due to extensive motion artifact. Advanced parenchymal volume loss with mild to moderate chronic microvascular ischemic disease. Large chronic left MCA territory infarct with associated encephalomalacia and gliosis. Additional chronic bilateral cerebellar infarcts, right greater than left. No definite foci of restricted diffusion to suggest acute or subacute ischemia. Gray-white matter differentiation otherwise maintained. No definite foci of susceptibility artifact to  suggest acute or chronic intracranial hemorrhage. No appreciable mass lesion on this motion degraded exam. No midline shift or mass effect. No hydrocephalus. No appreciable extra-axial fluid collection. Pituitary gland grossly normal. Vascular: Abnormal flow void within the left ICA to approximately the level of the terminus, suggesting slow flow and/or occlusion. Major intracranial vascular flow voids otherwise grossly maintained at the skull base. Skull and upper cervical spine: Craniocervical junction within normal limits. No focal marrow replacing lesion. Scalp soft tissues unremarkable. Sinuses/Orbits: Globes and orbital soft tissues grossly within normal limits. Scattered mucosal thickening throughout the lobe paranasal sinuses. No air-fluid levels identified. Mastoid air cells clear. Other: None. MRA HEAD FINDINGS ANTERIOR CIRCULATION: Examination severely limited by extensive motion artifact. Absent flow related signal within the left ICA to the terminus, consistent with occlusion. Right ICA grossly patent to the terminus. Evaluation for stenosis limited due to extensive motion. A1 segments patent bilaterally. Hypoplastic left A1. Grossly patent and normal anterior communicating artery. Anterior cerebral arteries grossly patent to their mid aspects. M1 segments patent bilaterally without occlusion or appreciable stenosis. Normal MCA bifurcations. Right MCA branches perfused. Left MCA branches perfused as well though appear attenuated as compared to the right, likely related to chronic left MCA territory infarct. POSTERIOR CIRCULATION: Vertebral arteries grossly patent at  the skull base. Posterior inferior cerebral arteries patent proximally. Basilar grossly patent to its distal aspect. No appreciable basilar stenosis on this motion degraded exam. Superior cerebral arteries patent proximally. Both of the PCAs primarily supplied via the basilar, although small bilateral posterior communicating arteries noted.  PCAs grossly patent to their distal aspects. Possible short-segment severe right P2 stenosis noted (series 1052, image 15). MRA NECK FINDINGS Examination is essentially nondiagnostic due to extensive motion artifact. Right common and internal carotid arteries grossly patent. Some flow related signal seen within the left CCA, although the left ICA not well seen and may be occluded. Partially visualized vertebral arteries patent with antegrade flow. Right vertebral artery appears dominant. Examination inadequate to evaluate for stenoses. IMPRESSION: MRI HEAD IMPRESSION: 1. Severely limited study due to extensive motion artifact. 2. No definite acute intracranial infarct or other abnormality identified. 3. 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 MRA HEAD IMPRESSION: 1. Severely limited exam due to extensive motion artifact. 2. Chronic left ICA occlusion. Distal reconstitution of the left MCA and ACA via flow across the circle-of-Willis. 3. Remainder of the major arterial vasculature of the head grossly patent without obvious abnormality. MRA NECK IMPRESSION: 1. Essentially nondiagnostic MRA of the neck due to severe motion artifact. 2. Grossly patent right carotid artery system and vertebral arteries. Partially visualized left CCA patent. Left ICA not visualized, and may be occluded within the neck. Electronically Signed   By: Rise Mu M.D.   On: 01/25/2018 05:59   Mr Maxine Glenn Neck Wo Contrast  Result Date: 01/25/2018 CLINICAL DATA:  Initial evaluation for acute encephalopathy, stroke. EXAM: MRI HEAD WITHOUT CONTRAST MRA HEAD WITHOUT CONTRAST MRA NECK WITHOUT CONTRAST TECHNIQUE: Multiplanar, multiecho pulse sequences of the brain and surrounding structures were obtained without intravenous contrast. Angiographic images of the Circle of Willis were obtained using MRA technique without intravenous contrast. Angiographic  images of the neck were obtained using MRA technique without intravenous contrast. Carotid stenosis measurements (when applicable) are obtained utilizing NASCET criteria, using the distal internal carotid diameter as the denominator. COMPARISON:  Prior CTs from 02/13/2018 FINDINGS: MRI HEAD FINDINGS Brain: Examination severely limited due to extensive motion artifact. Advanced parenchymal volume loss with mild to moderate chronic microvascular ischemic disease. Large chronic left MCA territory infarct with associated encephalomalacia and gliosis. Additional chronic bilateral cerebellar infarcts, right greater than left. No definite foci of restricted diffusion to suggest acute or subacute ischemia. Gray-white matter differentiation otherwise maintained. No definite foci of susceptibility artifact to suggest acute or chronic intracranial hemorrhage. No appreciable mass lesion on this motion degraded exam. No midline shift or mass effect. No hydrocephalus. No appreciable extra-axial fluid collection. Pituitary gland grossly normal. Vascular: Abnormal flow void within the left ICA to approximately the level of the terminus, suggesting slow flow and/or occlusion. Major intracranial vascular flow voids otherwise grossly maintained at the skull base. Skull and upper cervical spine: Craniocervical junction within normal limits. No focal marrow replacing lesion. Scalp soft tissues unremarkable. Sinuses/Orbits: Globes and orbital soft tissues grossly within normal limits. Scattered mucosal thickening throughout the lobe paranasal sinuses. No air-fluid levels identified. Mastoid air cells clear. Other: None. MRA HEAD FINDINGS ANTERIOR CIRCULATION: Examination severely limited by extensive motion artifact. Absent flow related signal within the left ICA to the terminus, consistent with occlusion. Right ICA grossly patent to the terminus. Evaluation for stenosis limited due to extensive motion. A1 segments patent bilaterally.  Hypoplastic left A1. Grossly patent and  normal anterior communicating artery. Anterior cerebral arteries grossly patent to their mid aspects. M1 segments patent bilaterally without occlusion or appreciable stenosis. Normal MCA bifurcations. Right MCA branches perfused. Left MCA branches perfused as well though appear attenuated as compared to the right, likely related to chronic left MCA territory infarct. POSTERIOR CIRCULATION: Vertebral arteries grossly patent at the skull base. Posterior inferior cerebral arteries patent proximally. Basilar grossly patent to its distal aspect. No appreciable basilar stenosis on this motion degraded exam. Superior cerebral arteries patent proximally. Both of the PCAs primarily supplied via the basilar, although small bilateral posterior communicating arteries noted. PCAs grossly patent to their distal aspects. Possible short-segment severe right P2 stenosis noted (series 1052, image 15). MRA NECK FINDINGS Examination is essentially nondiagnostic due to extensive motion artifact. Right common and internal carotid arteries grossly patent. Some flow related signal seen within the left CCA, although the left ICA not well seen and may be occluded. Partially visualized vertebral arteries patent with antegrade flow. Right vertebral artery appears dominant. Examination inadequate to evaluate for stenoses. IMPRESSION: MRI HEAD IMPRESSION: 1. Severely limited study due to extensive motion artifact. 2. No definite acute intracranial infarct or other abnormality identified. 3. 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 MRA HEAD IMPRESSION: 1. Severely limited exam due to extensive motion artifact. 2. Chronic left ICA occlusion. Distal reconstitution of the left MCA and ACA via flow across the circle-of-Willis. 3. Remainder of the major arterial vasculature of the head grossly patent without obvious  abnormality. MRA NECK IMPRESSION: 1. Essentially nondiagnostic MRA of the neck due to severe motion artifact. 2. Grossly patent right carotid artery system and vertebral arteries. Partially visualized left CCA patent. Left ICA not visualized, and may be occluded within the neck. Electronically Signed   By: Rise Mu M.D.   On: 01/25/2018 05:59   Mr Brain Wo Contrast  Result Date: 01/25/2018 CLINICAL DATA:  Initial evaluation for acute encephalopathy, stroke. EXAM: MRI HEAD WITHOUT CONTRAST MRA HEAD WITHOUT CONTRAST MRA NECK WITHOUT CONTRAST TECHNIQUE: Multiplanar, multiecho pulse sequences of the brain and surrounding structures were obtained without intravenous contrast. Angiographic images of the Circle of Willis were obtained using MRA technique without intravenous contrast. Angiographic images of the neck were obtained using MRA technique without intravenous contrast. Carotid stenosis measurements (when applicable) are obtained utilizing NASCET criteria, using the distal internal carotid diameter as the denominator. COMPARISON:  Prior CTs from 02/15/2018 FINDINGS: MRI HEAD FINDINGS Brain: Examination severely limited due to extensive motion artifact. Advanced parenchymal volume loss with mild to moderate chronic microvascular ischemic disease. Large chronic left MCA territory infarct with associated encephalomalacia and gliosis. Additional chronic bilateral cerebellar infarcts, right greater than left. No definite foci of restricted diffusion to suggest acute or subacute ischemia. Gray-white matter differentiation otherwise maintained. No definite foci of susceptibility artifact to suggest acute or chronic intracranial hemorrhage. No appreciable mass lesion on this motion degraded exam. No midline shift or mass effect. No hydrocephalus. No appreciable extra-axial fluid collection. Pituitary gland grossly normal. Vascular: Abnormal flow void within the left ICA to approximately the level of the  terminus, suggesting slow flow and/or occlusion. Major intracranial vascular flow voids otherwise grossly maintained at the skull base. Skull and upper cervical spine: Craniocervical junction within normal limits. No focal marrow replacing lesion. Scalp soft tissues unremarkable. Sinuses/Orbits: Globes and orbital soft tissues grossly within normal limits. Scattered mucosal thickening throughout the lobe paranasal sinuses. No air-fluid levels identified. Mastoid  air cells clear. Other: None. MRA HEAD FINDINGS ANTERIOR CIRCULATION: Examination severely limited by extensive motion artifact. Absent flow related signal within the left ICA to the terminus, consistent with occlusion. Right ICA grossly patent to the terminus. Evaluation for stenosis limited due to extensive motion. A1 segments patent bilaterally. Hypoplastic left A1. Grossly patent and normal anterior communicating artery. Anterior cerebral arteries grossly patent to their mid aspects. M1 segments patent bilaterally without occlusion or appreciable stenosis. Normal MCA bifurcations. Right MCA branches perfused. Left MCA branches perfused as well though appear attenuated as compared to the right, likely related to chronic left MCA territory infarct. POSTERIOR CIRCULATION: Vertebral arteries grossly patent at the skull base. Posterior inferior cerebral arteries patent proximally. Basilar grossly patent to its distal aspect. No appreciable basilar stenosis on this motion degraded exam. Superior cerebral arteries patent proximally. Both of the PCAs primarily supplied via the basilar, although small bilateral posterior communicating arteries noted. PCAs grossly patent to their distal aspects. Possible short-segment severe right P2 stenosis noted (series 1052, image 15). MRA NECK FINDINGS Examination is essentially nondiagnostic due to extensive motion artifact. Right common and internal carotid arteries grossly patent. Some flow related signal seen within the  left CCA, although the left ICA not well seen and may be occluded. Partially visualized vertebral arteries patent with antegrade flow. Right vertebral artery appears dominant. Examination inadequate to evaluate for stenoses. IMPRESSION: MRI HEAD IMPRESSION: 1. Severely limited study due to extensive motion artifact. 2. No definite acute intracranial infarct or other abnormality identified. 3. 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 MRA HEAD IMPRESSION: 1. Severely limited exam due to extensive motion artifact. 2. Chronic left ICA occlusion. Distal reconstitution of the left MCA and ACA via flow across the circle-of-Willis. 3. Remainder of the major arterial vasculature of the head grossly patent without obvious abnormality. MRA NECK IMPRESSION: 1. Essentially nondiagnostic MRA of the neck due to severe motion artifact. 2. Grossly patent right carotid artery system and vertebral arteries. Partially visualized left CCA patent. Left ICA not visualized, and may be occluded within the neck. Electronically Signed   By: Rise Mu M.D.   On: 01/25/2018 05:59   US Abdomen Complete  Result Date: 01/25/2018 CLINICAL DATA:  Elevated bilirubin and transaminitis. EXAM: ABDOMEN ULTRASOUND COMPLETE COMPARISON:  CT 08/02/2012 FINDINGS: Gallbladder: Mild gallbladder wall thickening. No visible stones. Some gallbladder sludge. Question Murphy sign, difficult to assess given the patient's clinical condition. Cholecystitis is not excluded. Common bile duct: Diameter: 4 mm, normal. Liver: Increased echogenicity of the liver parenchyma suggesting fatty change. No focal liver lesions seen. Portal vein is patent on color Doppler imaging with normal direction of blood flow towards the liver. IVC: No abnormality visualized. Pancreas: Not seen because of overlying bowel gas. Spleen: Size and appearance within normal limits. Right Kidney:  Length: 14.6 cm. Multiple cysts, the largest measuring 5 cm in diameter. Upper pole location accounts for the enlarged renal length. Left Kidney: Length: 11.5 cm. 1.5 cm simple cyst. Echogenicity within normal limits. No mass or hydronephrosis visualized. Abdominal aorta: Poorly seen because of overlying bowel gas. Other findings: Ascites noted surrounding the liver. IMPRESSION: Abnormal appearing gallbladder. Wall thickening. Sludge. Question Murphy sign. No visible stones. Consider nuclear medicine study, as cholecystitis is not excluded given this constellation of findings. Small amount of perihepatic ascites. Renal cysts. Electronically Signed   By: Paulina Fusi M.D.   On: 01/25/2018 10:05   Dg Chest Portable 1  View  Result Date: 02/18/2018 CLINICAL DATA:  Acute presentation with altered mental status. EXAM: PORTABLE CHEST 1 VIEW COMPARISON:  12/30/2017 FINDINGS: Chronic cardiomegaly. Venous hypertension without edema. No effusions. No infiltrate, collapse or effusion. Aortic atherosclerosis. No acute bone finding. IMPRESSION: Chronic cardiomegaly and pulmonary venous hypertension. Aortic atherosclerosis. Electronically Signed   By: Paulina Fusi M.D.   On: 02/11/2018 13:19   Dg Hip Unilat With Pelvis 1v Right  Result Date: 01/25/2018 CLINICAL DATA:  R/o foreign body for mri EXAM: DG HIP (WITH OR WITHOUT PELVIS) 1V RIGHT COMPARISON:  None. FINDINGS: Cannulated screw spans the sacrum. RIGHT hip is located. Remote fracture of the inferior pubic ramus IMPRESSION: No acute fracture dislocation. Cannulated screw spans the SI joints Electronically Signed   By: Genevive Bi M.D.   On: 01/25/2018 02:50   Ct Head Code Stroke Wo Contrast  Result Date: 01/23/2018 CLINICAL DATA:  Code stroke. Right-sided weakness. Possible seizure activity. EXAM: CT HEAD WITHOUT CONTRAST TECHNIQUE: Contiguous axial images were obtained from the base of the skull through the vertex without intravenous contrast. COMPARISON:   01/16/2018 FINDINGS: Brain: A large chronic left MCA territory infarct is again seen as well as chronic right larger than left cerebellar infarcts. There's no evidence of acute large territory infarct, intracranial hemorrhage, mass, midline shift, or extra-axial fluid collection. Periventricular white matter hypodensities are unchanged and nonspecific but compatible with mild chronic small vessel ischemic disease. There is ex vacuo dilatation of the left lateral ventricle. Vascular: Calcified atherosclerosis including chronic calcified plaque or embolus in the proximal left MCA, unchanged. Skull: No acute fracture or suspicious osseous lesion. Sinuses/Orbits: Unremarkable orbits. Paranasal sinuses and mastoid air cells are clear. Other: Scattered foci of venous gas including in the cavernous sinuses, likely iatrogenic. ASPECTS [facility] Stroke Program Early CT Score) Not scored in the setting of a large chronic MCA infarct. IMPRESSION: 1. No evidence of acute intracranial abnormality. 2. Large chronic left MCA infarct. 3. Chronic bilateral cerebellar infarcts. These results were called by telephone at the time of interpretation on 01/27/2018 at 11:45 am to [facility], who verbally acknowledged these results. Electronically Signed   By: Sebastian Ache M.D.   On: 02/05/2018 11:58    Cardiac Studies   Echo 01/25/18: Study Conclusions  - Left ventricle: The cavity size was mildly dilated. Systolic   function was severely reduced. The estimated ejection fraction   was in the range of 10% to 15%. Diffuse hypokinesis. Features are   consistent with a pseudonormal left ventricular filling pattern,   with concomitant abnormal relaxation and increased filling   pressure (grade 2 diastolic dysfunction). There was an apical   thrombus. - Aortic valve: Transvalvular velocity was within the normal range.   There was no stenosis. There was no regurgitation. - Mitral valve: Transvalvular velocity was within  the normal range.   There was no evidence for stenosis. There was mild regurgitation. - Left atrium: The atrium was moderately dilated. - Right ventricle: The cavity size was normal. Wall thickness was   normal. Systolic function was normal. - Right atrium: The atrium was moderately dilated. - Tricuspid valve: There was mild-moderate regurgitation. - Pulmonic valve: There was mild to moderate regurgitation. - Pulmonary arteries: Systolic pressure was mildly increased. PA   peak pressure: 42 mm Hg (S).  Impressions:  - Apical thrombus noted. Recommend anticoagulation.  Patient Profile     77 y.o. male with DM, hypertension, hyperlipidemia, prior stroke, COPD, dementia, and tobacco abuse here with altered mental  status and acute cardiogenic shock.  Assessment & Plan    # Cardiogenic shock: Echo this admission revealed newly diagnosed LVEF 10 to 15%.  Blood pressure is relatively stable, however his blood pressure dropped after receiving IV metoprolol for AVNRT.  Blood pressure improved and he has tolerated starting a Lasix drip.  He remains somnolent and is not a candidate for an ischemic evaluation at this time, though he will need one if his mental status improves.  He does have dementia at baseline.  No family is at bedside to better understand his baseline functional status.  Continue Lasix drip. Thus far good urinary response.  Repeat lactate is pending.  Very low threshold to start milrinone infusion.  Will reassess early afternoon.    # SVT: Mr. Cedano had a recurrent episode of SVT this morning.  Carotid massage was performed at the bedside.  He also received 2.5 mg of IV metoprolol and the rhythm broke back to sinus rhythm with multiple PVCs.  His blood pressure does not tolerate nodal agents.  Given that this is his second time having a sustained tachyarrhythmia that was not well-tolerated hemodynamically, we will start amiodarone IV.  # LV thrombus;  Continue heparin.  Will  transition to warfarin once prognosis is more clear and no procedures are needed.   Time spent: 45 minutes-Greater than 50% of this time was spent in counseling, explanation of diagnosis, planning of further management, and coordination of care.      For questions or updates, please contact [facility] Please consult [email or web address] for contact info under        Signed, Chilton Si, MD  01/26/2018, 10:12 AM

## 2018-01-27 DIAGNOSIS — K7200 Acute and subacute hepatic failure without coma: Secondary | ICD-10-CM | POA: Diagnosis not present

## 2018-01-27 DIAGNOSIS — L8990 Pressure ulcer of unspecified site, unspecified stage: Secondary | ICD-10-CM | POA: Diagnosis present

## 2018-01-27 DIAGNOSIS — R570 Cardiogenic shock: Secondary | ICD-10-CM | POA: Diagnosis present

## 2018-01-27 DIAGNOSIS — K7290 Hepatic failure, unspecified without coma: Secondary | ICD-10-CM

## 2018-01-27 DIAGNOSIS — Z515 Encounter for palliative care: Secondary | ICD-10-CM

## 2018-01-27 DIAGNOSIS — N19 Unspecified kidney failure: Secondary | ICD-10-CM

## 2018-01-27 DIAGNOSIS — N179 Acute kidney failure, unspecified: Secondary | ICD-10-CM | POA: Diagnosis present

## 2018-01-27 LAB — CBC WITH DIFFERENTIAL/PLATELET
Abs Immature Granulocytes: 0.12 10*3/uL — ABNORMAL HIGH (ref 0.00–0.07)
Basophils Absolute: 0 10*3/uL (ref 0.0–0.1)
Basophils Relative: 0 %
Eosinophils Absolute: 0 10*3/uL (ref 0.0–0.5)
Eosinophils Relative: 0 %
HCT: 43 % (ref 39.0–52.0)
Hemoglobin: 14.1 g/dL (ref 13.0–17.0)
Immature Granulocytes: 1 %
Lymphocytes Relative: 8 %
Lymphs Abs: 1 10*3/uL (ref 0.7–4.0)
MCH: 30.1 pg (ref 26.0–34.0)
MCHC: 32.8 g/dL (ref 30.0–36.0)
MCV: 91.7 fL (ref 80.0–100.0)
MONOS PCT: 11 %
Monocytes Absolute: 1.4 10*3/uL — ABNORMAL HIGH (ref 0.1–1.0)
Neutro Abs: 10.1 10*3/uL — ABNORMAL HIGH (ref 1.7–7.7)
Neutrophils Relative %: 80 %
Platelets: 163 10*3/uL (ref 150–400)
RBC: 4.69 MIL/uL (ref 4.22–5.81)
RDW: 17.1 % — ABNORMAL HIGH (ref 11.5–15.5)
WBC: 12.6 10*3/uL — ABNORMAL HIGH (ref 4.0–10.5)
nRBC: 1.2 % — ABNORMAL HIGH (ref 0.0–0.2)

## 2018-01-27 LAB — BLOOD CULTURE ID PANEL (REFLEXED)

## 2018-01-27 LAB — BASIC METABOLIC PANEL
ANION GAP: 23 — AB (ref 5–15)
BUN: 64 mg/dL — ABNORMAL HIGH (ref 8–23)
CO2: 13 mmol/L — ABNORMAL LOW (ref 22–32)
Calcium: 8.4 mg/dL — ABNORMAL LOW (ref 8.9–10.3)
Chloride: 112 mmol/L — ABNORMAL HIGH (ref 98–111)
Creatinine, Ser: 2.54 mg/dL — ABNORMAL HIGH (ref 0.61–1.24)
GFR calc Af Amer: 27 mL/min — ABNORMAL LOW (ref >60)
GFR calc non Af Amer: 24 mL/min — ABNORMAL LOW (ref >60)
GLUCOSE: 150 mg/dL — AB (ref 70–99)
Potassium: 5.4 mmol/L — ABNORMAL HIGH (ref 3.5–5.1)
Sodium: 148 mmol/L — ABNORMAL HIGH (ref 135–145)

## 2018-01-27 LAB — HEPATIC FUNCTION PANEL
ALT: 1137 U/L — ABNORMAL HIGH (ref 0–44)
AST: 1580 U/L — AB (ref 15–41)
Albumin: 3.1 g/dL — ABNORMAL LOW (ref 3.5–5.0)
Alkaline Phosphatase: 120 U/L (ref 38–126)
BILIRUBIN DIRECT: 2.4 mg/dL — AB (ref 0.0–0.2)
Indirect Bilirubin: 2.1 mg/dL — ABNORMAL HIGH (ref 0.3–0.9)
Total Bilirubin: 4.5 mg/dL — ABNORMAL HIGH (ref 0.3–1.2)
Total Protein: 6.2 g/dL — ABNORMAL LOW (ref 6.5–8.1)

## 2018-01-27 LAB — HEPARIN LEVEL (UNFRACTIONATED): HEPARIN UNFRACTIONATED: 0.64 [IU]/mL (ref 0.30–0.70)

## 2018-01-27 LAB — PHOSPHORUS: Phosphorus: 5.8 mg/dL — ABNORMAL HIGH (ref 2.5–4.6)

## 2018-01-27 LAB — MAGNESIUM: Magnesium: 2.5 mg/dL — ABNORMAL HIGH (ref 1.7–2.4)

## 2018-01-27 MED ORDER — GLYCOPYRROLATE 0.2 MG/ML IJ SOLN
0.2000 mg | INTRAMUSCULAR | Status: DC | PRN
  Administered 2018-01-27 – 2018-01-28 (×3): 0.2 mg via INTRAVENOUS
  Filled 2018-01-27 (×3): qty 1

## 2018-01-27 MED ORDER — GLYCOPYRROLATE 0.2 MG/ML IJ SOLN: 0.2000 mg | INTRAMUSCULAR | Status: DC | PRN

## 2018-01-27 MED ORDER — ACETAMINOPHEN 325 MG PO TABS: 650.0000 mg | ORAL_TABLET | Freq: Four times a day (QID) | ORAL | Status: DC | PRN

## 2018-01-27 MED ORDER — HALOPERIDOL LACTATE 5 MG/ML IJ SOLN: 0.5000 mg | INTRAMUSCULAR | Status: DC | PRN

## 2018-01-27 MED ORDER — BIOTENE DRY MOUTH MT LIQD: 15.0000 mL | OROMUCOSAL | Status: DC | PRN

## 2018-01-27 MED ORDER — LORAZEPAM 2 MG/ML IJ SOLN
0.5000 mg | INTRAMUSCULAR | Status: DC
  Administered 2018-01-27 – 2018-01-28 (×3): 0.5 mg via INTRAVENOUS
  Filled 2018-01-27 (×3): qty 1

## 2018-01-27 MED ORDER — GLYCOPYRROLATE 1 MG PO TABS
1.0000 mg | ORAL_TABLET | ORAL | Status: DC | PRN
  Filled 2018-01-27: qty 1

## 2018-01-27 MED ORDER — ONDANSETRON HCL 4 MG/2ML IJ SOLN: 4.0000 mg | Freq: Four times a day (QID) | INTRAMUSCULAR | Status: DC | PRN

## 2018-01-27 MED ORDER — POLYVINYL ALCOHOL 1.4 % OP SOLN
1.0000 [drp] | Freq: Four times a day (QID) | OPHTHALMIC | Status: DC | PRN
  Filled 2018-01-27: qty 15

## 2018-01-27 MED ORDER — ONDANSETRON 4 MG PO TBDP: 4.0000 mg | ORAL_TABLET | Freq: Four times a day (QID) | ORAL | Status: DC | PRN

## 2018-01-27 MED ORDER — HYDROMORPHONE HCL 1 MG/ML IJ SOLN: 1.0000 mg | Freq: Once | INTRAMUSCULAR | Status: AC

## 2018-01-27 MED ORDER — ACETAMINOPHEN 650 MG RE SUPP: 650.0000 mg | Freq: Four times a day (QID) | RECTAL | Status: DC | PRN

## 2018-01-27 MED ORDER — HALOPERIDOL LACTATE 2 MG/ML PO CONC
0.5000 mg | ORAL | Status: DC | PRN
  Filled 2018-01-27: qty 0.3

## 2018-01-27 MED ORDER — SODIUM CHLORIDE 0.9 % IV SOLN
1.0000 mg/h | INTRAVENOUS | Status: DC
  Administered 2018-01-27: 1 mg/h via INTRAVENOUS
  Filled 2018-01-27: qty 2.5
  Filled 2018-01-27: qty 5

## 2018-01-27 MED ORDER — HALOPERIDOL 0.5 MG PO TABS
0.5000 mg | ORAL_TABLET | ORAL | Status: DC | PRN
  Filled 2018-01-27: qty 1

## 2018-01-27 MED ORDER — HYDROMORPHONE HCL 1 MG/ML IJ SOLN
2.0000 mg | INTRAMUSCULAR | Status: DC | PRN
  Administered 2018-01-27: 2 mg via INTRAVENOUS
  Administered 2018-01-27: 1.5 mg via INTRAVENOUS
  Administered 2018-01-27: 2 mg via INTRAVENOUS
  Filled 2018-01-27 (×3): qty 2

## 2018-01-27 MED ORDER — SODIUM CHLORIDE 0.9 % IV SOLN
INTRAVENOUS | Status: DC | PRN
  Administered 2018-01-27: 18:00:00 via INTRAVENOUS

## 2018-01-27 NOTE — Progress Notes (Signed)
ANTICOAGULATION CONSULT NOTE  Pharmacy Consult for Heparin  Indication: LV thrombus  No Known Allergies  Patient Measurements: Height: 5\' 9"  (175.3 cm) Weight: 190 lb 7.6 oz (86.4 kg) IBW/kg (Calculated) : 70.7  Vital Signs: Temp: 97.6 F (36.4 C) (12/09 0400) Temp Source: Oral (12/09 0400) BP: 86/72 (12/09 0700) Pulse Rate: 71 (12/09 0700)  Labs: Recent Labs    02/01/2018 1149 01/25/18 0709 01/26/18 0151 01/26/18 0223 01/26/18 0951 01/26/18 1327 01/26/18 1628 01/26/18 2130 01/27/18 0307 01/27/18 0553  HGB 14.0 13.9 15.0  --   --   --   --   --  14.1  --   HCT 43.2 42.5 45.7  --   --   --   --   --  43.0  --   PLT 155 150 160  --   --   --   --   --  163  --   APTT 37*  --   --   --   --   --   --   --   --   --   LABPROT 23.3*  --   --   --   --   --   --   --   --   --   INR 2.10  --   --   --   --   --   --   --   --   --   HEPARINUNFRC  --   --   --   --   --  0.48  --  0.86*  --  0.64  CREATININE 1.97* 1.82* 1.92*  --  2.03*  --  2.17*  --  2.54*  --   TROPONINI  --   --   --  0.08*  --   --   --   --   --   --     Estimated Creatinine Clearance: 26.9 mL/min (A) (by C-G formula based on SCr of 2.54 mg/dL (H)).   Medical History: Past Medical History:  Diagnosis Date  . Benign prostate hyperplasia   . Diabetes mellitus without complication (HCC)   . Dysphagia   . Hyperlipemia   . Hypertension   . Stroke [facility](HCC)     Assessment: 77 y/o M came in as a code stroke on 12/6, still undergoing stroke work-up, has a hx of stroke previously on Apixaban, but that was stopped about one month ago. Pt found to have LV thrombus on ECHO>>starting heparin. CBC good, noted renal dysfunction. INR was elevated at 2.10 on 12/6, but this is likely due to his liver dysfunction as AST and ALT acutely worsening.   Heparin level this morning above lower goal range for CVA.  No overt bleeding or complications noted.  CBC stable.  Goal of Therapy:  Heparin level 0.3-0.5 units/ml,  still undergoing stroke work-up Monitor platelets by anticoagulation protocol: Yes   Plan:  D/c IV Heparin Per medical team, planning transition to comfort care shortly.  Jenetta DownerJessica Tashe Purdon, Pharm D, BCPS, [facility] Clinical Pharmacist Phone [phone]  01/27/2018 7:53 AM

## 2018-01-27 NOTE — Progress Notes (Signed)
Progress Note  Patient Name: Oscar Salazar Date of Encounter: 01/27/2018  Primary Cardiologist: Chilton Si, MD   Subjective   Unable to obtain - patient continues to be somnolent and not responsive.  Overnight events reviewed - patient with significant agitation. Restraints and haldol ordered for patient safety. Per Crit care's discussion with HCP - family would not like to pursue advanced measures and prefers palliation. Awaiting palliative care consult.    Inpatient Medications    Scheduled Meds: . chlorhexidine  15 mL Mouth Rinse BID  .  HYDROmorphone (DILAUDID) injection  1 mg Intravenous Once  . LORazepam  0.5 mg Intravenous Q4H  . mouth rinse  15 mL Mouth Rinse q12n4p   Continuous Infusions:  PRN Meds: acetaminophen **OR** acetaminophen, antiseptic oral rinse, glycopyrrolate **OR** glycopyrrolate **OR** glycopyrrolate, haloperidol **OR** haloperidol **OR** haloperidol lactate, HYDROmorphone (DILAUDID) injection, iopamidol, ondansetron **OR** ondansetron (ZOFRAN) IV, polyvinyl alcohol   Vital Signs    Vitals:   01/27/18 0530 01/27/18 0605 01/27/18 0635 01/27/18 0700  BP: 94/78 91/62 100/81 (!) 86/72  Pulse: 88 94 (!) 37 71  Resp: (!) 26 (!) 36 (!) 35 18  Temp:    (!) 97 F (36.1 C)  TempSrc:    Oral  SpO2: 100% 100% 98% (!) 83%  Weight:      Height:        Intake/Output Summary (Last 24 hours) at 01/27/2018 0910 Last data filed at 01/27/2018 0700 Gross per 24 hour  Intake 770.46 ml  Output 875 ml  Net -104.54 ml   Filed Weights   02/16/2018 1100 01/26/2018 1220  Weight: 86.4 kg 86.4 kg    Telemetry    Sinus rhythm with occasional PVCs, missed beats, and brief tachyarrhythmias - Personally Reviewed  Physical Exam   GEN: Somnolent, in no acute distress.   Neck: No JVD, no carotid bruits Cardiac: RRR, no murmurs, rubs, or gallops.  Respiratory: Clear to auscultation bilaterally, no wheezes/ rales/ rhonchi GI: NABS, Soft, nontender, non-distended    MS: No edema; No deformity. Feet are cool to touch Neuro:  sedated - unable to assess Psych: calm  Labs    Chemistry Recent Labs  Lab 01/25/18 0709 01/26/18 0151 01/26/18 0951 01/26/18 1628 01/27/18 0307  NA 141 144 141 143 148*  K 4.8 5.3* 4.7 5.4* 5.4*  CL 107 115* 113* 116* 112*  CO2 19* 12* 17* 13* 13*  GLUCOSE 145* 165* 182* 124* 150*  BUN 50* 53* 50* 54* 64*  CREATININE 1.82* 1.92* 2.03* 2.17* 2.54*  CALCIUM 8.8* 8.5* 8.2* 8.3* 8.4*  PROT 6.1* 6.2*  --   --  6.2*  ALBUMIN 3.1* 3.1*  --   --  3.1*  AST 356* 803*  --   --  1,580*  ALT 496* 762*  --   --  1,137*  ALKPHOS 134* 128*  --   --  120  BILITOT 2.7* 3.2*  --   --  4.5*  GFRNONAA 35* 33* 31* 29* 24*  GFRAA 41* 38* 36* 33* 27*  ANIONGAP 15 17* 11 14 23*     Hematology Recent Labs  Lab 01/25/18 0709 01/26/18 0151 01/27/18 0307  WBC 8.1 8.8 12.6*  RBC 4.71 5.00 4.69  HGB 13.9 15.0 14.1  HCT 42.5 45.7 43.0  MCV 90.2 91.4 91.7  MCH 29.5 30.0 30.1  MCHC 32.7 32.8 32.8  RDW 16.8* 17.0* 17.1*  PLT 150 160 163    Cardiac Enzymes Recent Labs  Lab 01/26/18 0223  TROPONINI 0.08*  Recent Labs  Lab 02/08/2018 1114  TROPIPOC 0.01     BNPNo results for input(s): BNP, PROBNP in the last 168 hours.   DDimer No results for input(s): DDIMER in the last 168 hours.   Radiology    Dg Chest 1 View  Result Date: 01/26/2018 CLINICAL DATA:  Rapid breathing EXAM: CHEST  1 VIEW COMPARISON:  None. FINDINGS: Stable enlarged cardiac silhouette. No effusion, infiltrate pneumothorax. No acute osseous abnormality. IMPRESSION: Cardiomegaly without acute findings. Electronically Signed   By: Genevive Bi M.D.   On: 01/26/2018 02:23   US Abdomen Complete  Result Date: 01/25/2018 CLINICAL DATA:  Elevated bilirubin and transaminitis. EXAM: ABDOMEN ULTRASOUND COMPLETE COMPARISON:  CT 08/02/2012 FINDINGS: Gallbladder: Mild gallbladder wall thickening. No visible stones. Some gallbladder sludge. Question Murphy sign,  difficult to assess given the patient's clinical condition. Cholecystitis is not excluded. Common bile duct: Diameter: 4 mm, normal. Liver: Increased echogenicity of the liver parenchyma suggesting fatty change. No focal liver lesions seen. Portal vein is patent on color Doppler imaging with normal direction of blood flow towards the liver. IVC: No abnormality visualized. Pancreas: Not seen because of overlying bowel gas. Spleen: Size and appearance within normal limits. Right Kidney: Length: 14.6 cm. Multiple cysts, the largest measuring 5 cm in diameter. Upper pole location accounts for the enlarged renal length. Left Kidney: Length: 11.5 cm. 1.5 cm simple cyst. Echogenicity within normal limits. No mass or hydronephrosis visualized. Abdominal aorta: Poorly seen because of overlying bowel gas. Other findings: Ascites noted surrounding the liver. IMPRESSION: Abnormal appearing gallbladder. Wall thickening. Sludge. Question Murphy sign. No visible stones. Consider nuclear medicine study, as cholecystitis is not excluded given this constellation of findings. Small amount of perihepatic ascites. Renal cysts. Electronically Signed   By: Paulina Fusi M.D.   On: 01/25/2018 10:05    Cardiac Studies   Echo 01/25/18: Study Conclusions  - Left ventricle: The cavity size was mildly dilated. Systolic function was severely reduced. The estimated ejection fraction was in the range of 10% to 15%. Diffuse hypokinesis. Features are consistent with a pseudonormal left ventricular filling pattern, with concomitant abnormal relaxation and increased filling pressure (grade 2 diastolic dysfunction). There was an apical thrombus. - Aortic valve: Transvalvular velocity was within the normal range. There was no stenosis. There was no regurgitation. - Mitral valve: Transvalvular velocity was within the normal range. There was no evidence for stenosis. There was mild regurgitation. - Left atrium: The atrium  was moderately dilated. - Right ventricle: The cavity size was normal. Wall thickness was normal. Systolic function was normal. - Right atrium: The atrium was moderately dilated. - Tricuspid valve: There was mild-moderate regurgitation. - Pulmonic valve: There was mild to moderate regurgitation. - Pulmonary arteries: Systolic pressure was mildly increased. PA peak pressure: 42 mm Hg (S).  Impressions:  - Apical thrombus noted. Recommend anticoagulation.  Patient Profile     77 y.o. male with DM, hypertension, hyperlipidemia, prior stroke, COPD, dementia, and tobacco abuse here with altered mental status and acute cardiogenic shock.  Assessment & Plan    1. Cardiogenic Shock: patient presented with AMS. Found to be in cardiogenic shock with EF 10-15%, diffuse hypokinesis, G2DD on echo and hypotension. Discussion between brother (HCP) and critical care with the decision to pursue palliation and avoid escalation of care (DNR/DNI now). Lasix discontinued this morning due to hypotension.  - Should patient make a meaningful recovery, could consider cardiac catheterization to further evaluate for ischemia - Agree with palliative care consult  2. LV thrombus: no longer on heparin gtt as patient transitions toward comfort care  3. SVT: initially placed on IV metoprolol with successful break in arrhythmia and return to sinus rhythm. Amiodarone started due to intolerance of AV nodal blocking agents. Now off amiodarone as patient transitions towards comfort care.  [facility] will sign off.   Medication Recommendations:  Comfort care medications per primary team Other recommendations (labs, testing, etc):  None Follow up as an outpatient:  None at this time. Please reach out to cardiology if patient makes a meaningful recover as further work-up may be indicated at that time.   For questions or updates, please contact [facility] Please consult [email or web address] for contact info under  Cardiology/STEMI.      Signed, Beatriz StallionKrista M. Kroeger, PA-C  01/27/2018, 9:10 AM   [phone]

## 2018-01-27 NOTE — Progress Notes (Signed)
   Subjective:   [name] was examined at bedside and is very somnolent. He does not easily arouse and seems very uncomfortable.  Objective:  Vital signs in last 24 hours: Vitals:   01/27/18 0530 01/27/18 0605 01/27/18 0635 01/27/18 0700  BP: 94/78 91/62 100/81 (!) 86/72  Pulse: 88 94 (!) 37 71  Resp: (!) 26 (!) 36 (!) 35 18  Temp:      TempSrc:      SpO2: 100% 100% 98% (!) 83%  Weight:      Height:       Physical Exam  Constitutional:  Ill appearing male lying in bed in no acute distress. Very somnolent. Not responding to verbal stimuli.  Cardiovascular: Normal rate.  Pulmonary/Chest:  Increased respiratory rate with increased work of breathing.   Abdominal: Soft. He exhibits no distension. There is no tenderness.    Assessment/Plan:  Active Problems:   Encephalopathy   Pressure injury of skin  [name] is a critically ill patient with multi-organ system failure including acute renal and hepatic failure, cardiogenic shock, and encephalopathy.  I spoke with his healthcare power of attorney Benna DunksHenry Lebow (brother).  I informed him of his brothers critical condition and verified that he would like to proceed with comfort care only.  He states that his brother would not want to be on life support.  He is interested in residential hospice care and [place]  I have consulted social work to help coordinate this.  We will proceed with comfort care including hydromorphone and lorazepam therapy.  Dispo: Anticipated discharge to hospice in 0 to 1 days.  Synetta ShadowPrince, Keala Drum M, MD 01/27/2018, 7:53 AM Pager: [phone] IMTS PGY-1

## 2018-01-27 NOTE — Progress Notes (Signed)
eLink Physician-Brief Progress Note Patient Name: Oscar HansenRoy Salazar DOB: 05-Feb-1941 MRN: [id number]   Date of Service  01/27/2018  HPI/Events of Note  Patient trying to get out of bed. Request for [facility] [employer] consultantsafety sitter.   eICU Interventions  Will order: 1. Soft waist belt. 2. Safety sitter at bedside.      Intervention Category Major Interventions: Delirium, psychosis, severe agitation - evaluation and management  Hiroshi Krummel Eugene 01/27/2018, 6:01 AM

## 2018-01-27 NOTE — Progress Notes (Signed)
Internal Medicine Teaching Service Attending:   I saw and examined the patient. I reviewed the resident's note and I agree with the resident's findings and plan as documented in the resident's note.  Principal Problem:   End of life care Active Problems:   Pressure injury of skin   Cardiogenic shock (HCC)   Shock liver   Acute renal failure (HCC)  77 year old man hospital day #3 now with cardiogenic shock which is causing multiorgan system failure. On exam this morning he appears uncomfortable, moaning in bed pulling on the monitors and IVs. He has course crackles throughout his lungs, he is tachypneic to 40 resps/min. His legs are warm without edema. On ultrasound he has diffuse B lines in his lungs, his LV EF is severely reduced, his IVC is severely dilated with no respiratory changes. His labs show worsening liver failure, renal failure, and lactic acidosis due to this low output heart failure.   The patient is critically ill and further care for these problems would involve aggressive life support including intubation, central line, and inotrope support; and prognosis would be poor. The patient's brother informs us that this would not be consistent with [name]' goals of care. He has asked us to transition to comfort-oriented care, which I agree is the most reasonable next step. Plan is to discontinue heparin and amiodarone. Start dilaudid and ativan as needed for discomfort. I anticipate an in-hospital death in hours to days. We will work with social work to see if there is inpatient hospice available closer to family.   Erlinda Honguncan Edithe Dobbin, MD FACP

## 2018-01-27 NOTE — Progress Notes (Signed)
PHARMACY - PHYSICIAN COMMUNICATION CRITICAL VALUE ALERT - BLOOD CULTURE IDENTIFICATION (BCID)  Oscar HansenRoy Salazar is an 77 y.o. male [facility] with a chief complaint of encephalopathy  Assessment: Pt was admitted for encephalopathy and MMP. Labs called this AM with BCID result of 1/4 bottles with staph species. Likely to be CNS and contaminant. No escalation of care per note. Will FYI page IMTS.  Name of physician (or Provider) Contacted: IMTS  Current antibiotics: None  Changes to prescribed antibiotics recommended:  None  Results for orders placed or performed during the hospital encounter of 01/22/2018  Blood Culture ID Panel (Reflexed) (Collected: 01/26/2018  7:34 AM)  Result Value Ref Range   Enterococcus species NOT DETECTED NOT DETECTED   Listeria monocytogenes NOT DETECTED NOT DETECTED   Staphylococcus species DETECTED (A) NOT DETECTED   Staphylococcus aureus (BCID) NOT DETECTED NOT DETECTED   Methicillin resistance DETECTED (A) NOT DETECTED   Streptococcus species NOT DETECTED NOT DETECTED   Streptococcus agalactiae NOT DETECTED NOT DETECTED   Streptococcus pneumoniae NOT DETECTED NOT DETECTED   Streptococcus pyogenes NOT DETECTED NOT DETECTED   Acinetobacter baumannii NOT DETECTED NOT DETECTED   Enterobacteriaceae species NOT DETECTED NOT DETECTED   Enterobacter cloacae complex NOT DETECTED NOT DETECTED   Escherichia coli NOT DETECTED NOT DETECTED   Klebsiella oxytoca NOT DETECTED NOT DETECTED   Klebsiella pneumoniae NOT DETECTED NOT DETECTED   Proteus species NOT DETECTED NOT DETECTED   Serratia marcescens NOT DETECTED NOT DETECTED   Haemophilus influenzae NOT DETECTED NOT DETECTED   Neisseria meningitidis NOT DETECTED NOT DETECTED   Pseudomonas aeruginosa NOT DETECTED NOT DETECTED   Candida albicans NOT DETECTED NOT DETECTED   Candida glabrata NOT DETECTED NOT DETECTED   Candida krusei NOT DETECTED NOT DETECTED   Candida parapsilosis NOT  DETECTED NOT DETECTED   Candida tropicalis NOT DETECTED NOT DETECTED    Ulyses SouthwardMinh Pham, PharmD, BCIDP, AAHIVP, CPP Infectious Disease Pharmacist 01/27/2018 9:14 AM

## 2018-01-29 LAB — CULTURE, BLOOD (ROUTINE X 2): Special Requests: ADEQUATE

## 2018-01-31 LAB — CULTURE, BLOOD (ROUTINE X 2)
Culture: NO GROWTH
Special Requests: ADEQUATE

## 2018-02-19 NOTE — Death Summary Note (Signed)
  Name: Mitzi HansenRoy Syme MRN: [id number] DOB: 1940-07-09 78 y.o.  Date of Admission: 02/13/2018 11:14 AM Date of Discharge: 02/18/2018 Attending Physician: Dr. 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 condition.    Hospital Course: Mr. Ashley RoyaltyMatthews presented with encephalopathy and found to have acute liver failure, acute renal failure, and low-output cardiogenic shock. He also developed supraventricular tachycardia which did not improve with amiodarone treatment. After a discussion with his HCPOA, family decided not to pursue advanced measures and opted for comfort-oriented care.  Signed: Synetta ShadowPrince, Margarita Croke M, MD 01/30/2018, 6:42 PM

## 2018-02-19 NOTE — Progress Notes (Signed)
Patient deceased. Dilaudid 50mg  in NS 100mls wasted witnessed by Jolly MangoSarah Debru CPht.

## 2018-02-19 NOTE — Progress Notes (Signed)
Time of Death is 2:51 am on 01/25/2018. No heart sounds auscultated and confirm with Hale Droneamon Meza. Patient's decision maker, Benna DunksHenry Mattice has been notified.

## 2018-02-19 NOTE — Progress Notes (Signed)
Wasted 20 CC dilaudid 50mg  IV drip with Shary DecampLatroya Short.

## 2018-02-19 DEATH — deceased

## 2019-03-15 IMAGING — US US ABDOMEN COMPLETE
1 series · 13 of 25 positions shown · non-contrast
Comparison: CT 08/02/2012

CLINICAL DATA: Elevated bilirubin and transaminitis.

EXAM:
ABDOMEN ULTRASOUND COMPLETE

[Series 1: us abdomen complete · 0.24mm/px · 13 of 72 slices shown]
[im 1/72]
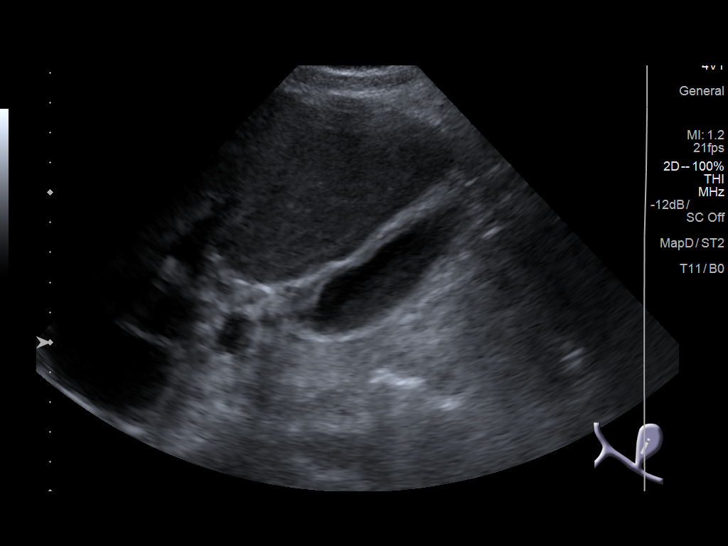
[im 6/72]
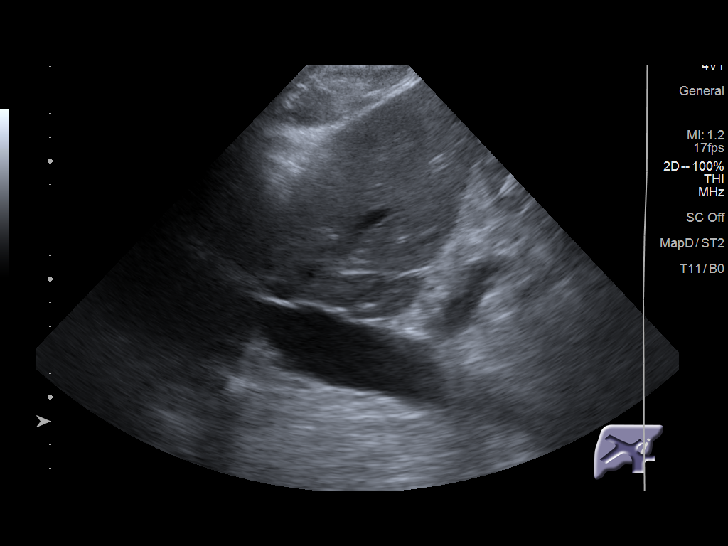
[im 12/72]
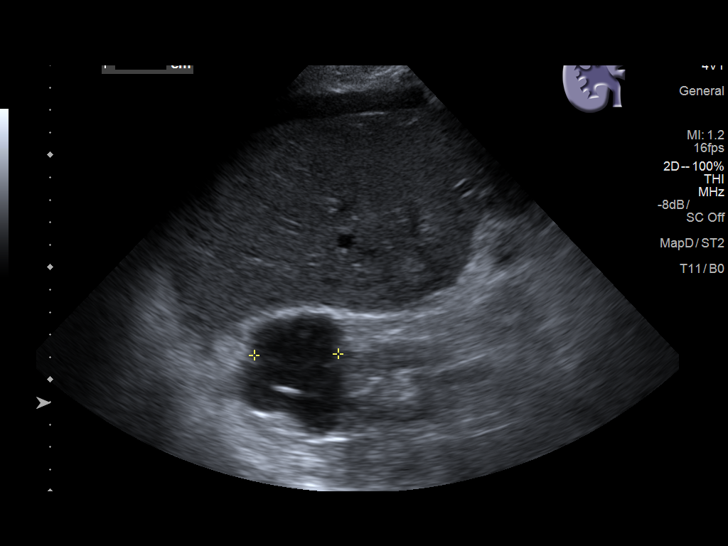
[im 18/72]
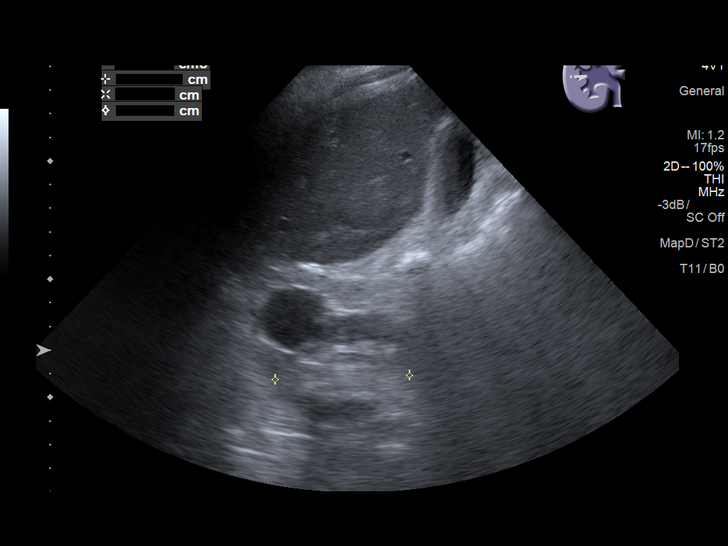
[im 24/72]
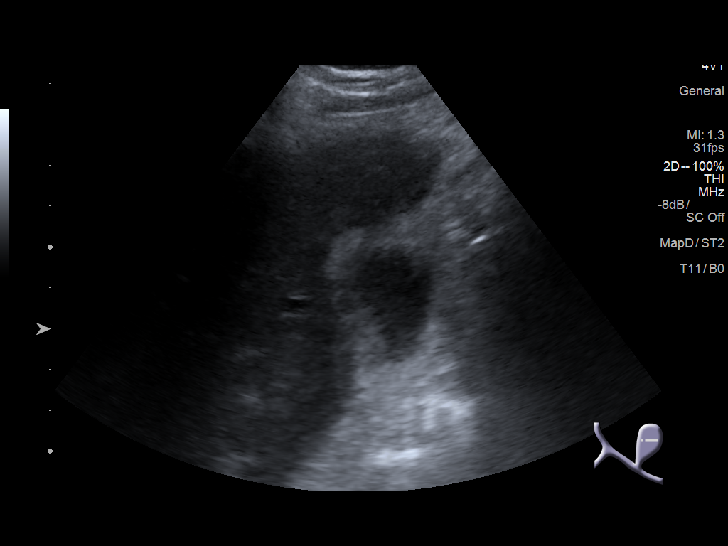
[im 30/72]
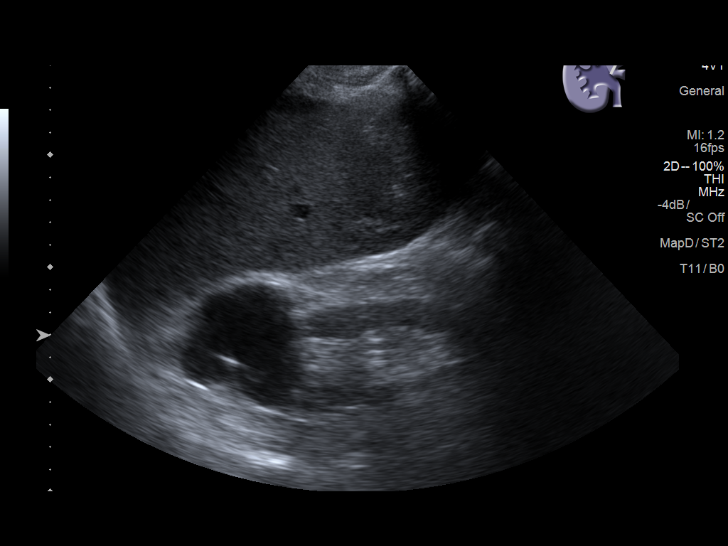
[im 36/72]
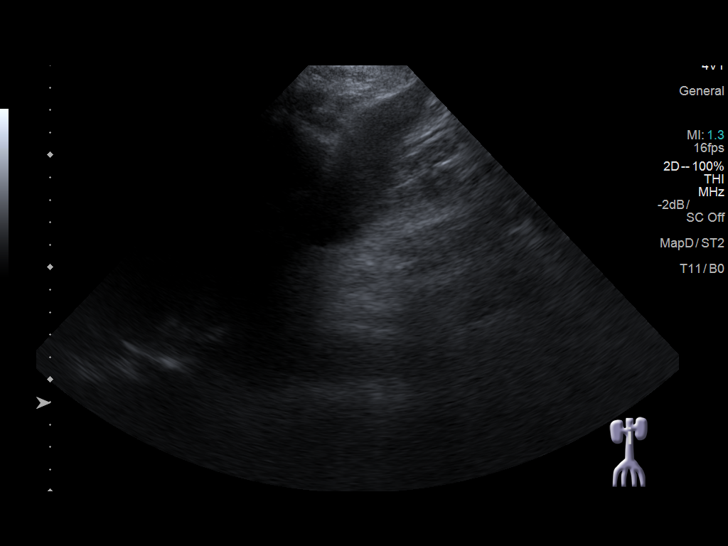
[im 42/72]
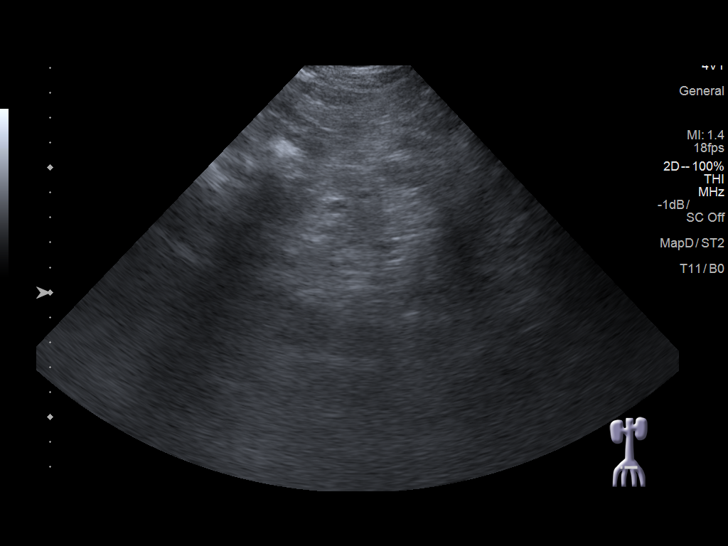
[im 48/72]
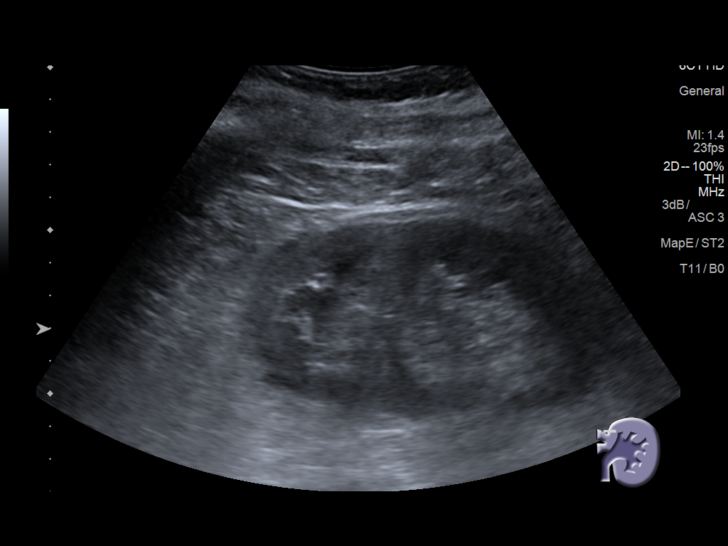
[im 54/72]
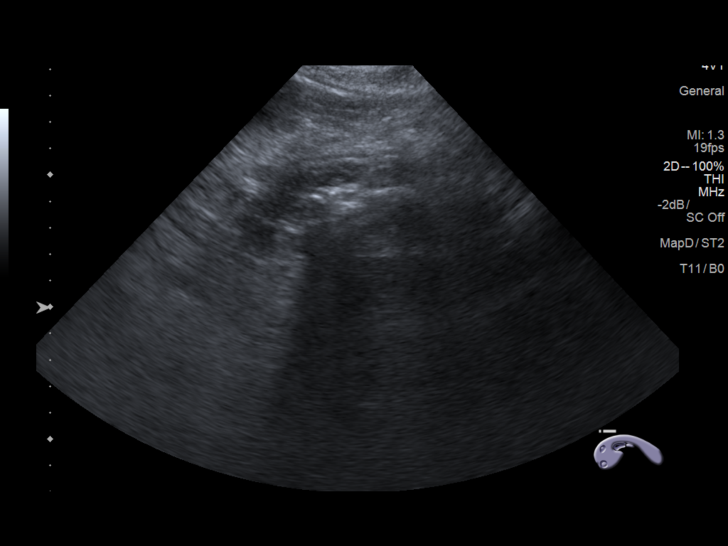
[im 60/72]
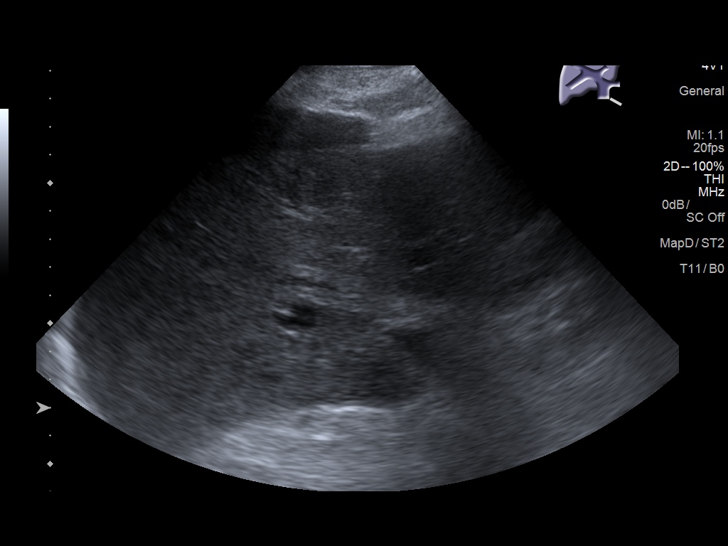
[im 66/72]
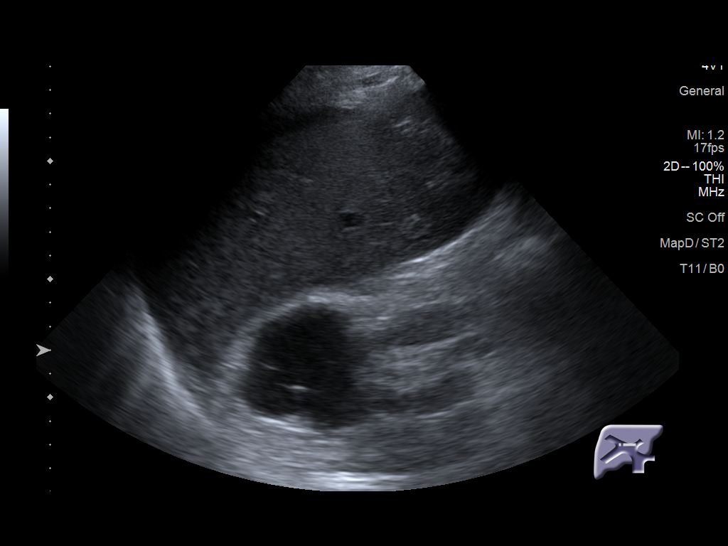
[im 72/72]
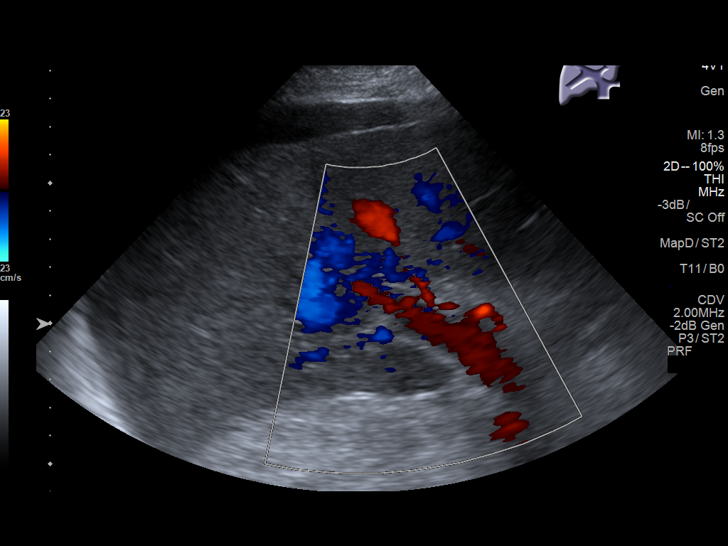

[13 of 25 positions shown; findings below may reference images not displayed]

FINDINGS: Gallbladder: Mild gallbladder wall thickening. No visible stones.
Some gallbladder sludge. Question Murphy sign, difficult to assess
given the patient's clinical condition. Cholecystitis is not
excluded.

Common bile duct: Diameter: 4 mm, normal.

Liver: Increased echogenicity of the liver parenchyma suggesting
fatty change. No focal liver lesions seen. Portal vein is patent on
color Doppler imaging with normal direction of blood flow towards
the liver.

IVC: No abnormality visualized.

Pancreas: Not seen because of overlying bowel gas.

Spleen: Size and appearance within normal limits.

Right Kidney: Length: 14.6 cm.. Multiple cysts, the largest
measuring 5 cm in diameter. Upper pole location accounts for the
enlarged renal length.

Left Kidney: Length: 11.5 cm. 1.5 cm simple cyst.. Echogenicity
within normal limits. No mass or hydronephrosis visualized.

Abdominal aorta: Poorly seen because of overlying bowel gas.

Other findings: Ascites noted surrounding the liver.
IMPRESSION: Abnormal appearing gallbladder. Wall thickening. Sludge. Question
Murphy sign. No visible stones. Consider nuclear medicine study, as
cholecystitis is not excluded given this constellation of findings.

Small amount of perihepatic ascites.

Renal cysts.

## 2019-12-03 IMAGING — CR DG ABDOMEN 1V
1 series · 1 of 1 positions shown · non-contrast
Comparison: None.

CLINICAL DATA: 77-year-old male. Evaluate for foreign object prior
to MRI scan.

EXAM:
ABDOMEN - 1 VIEW

[abdomen kub]
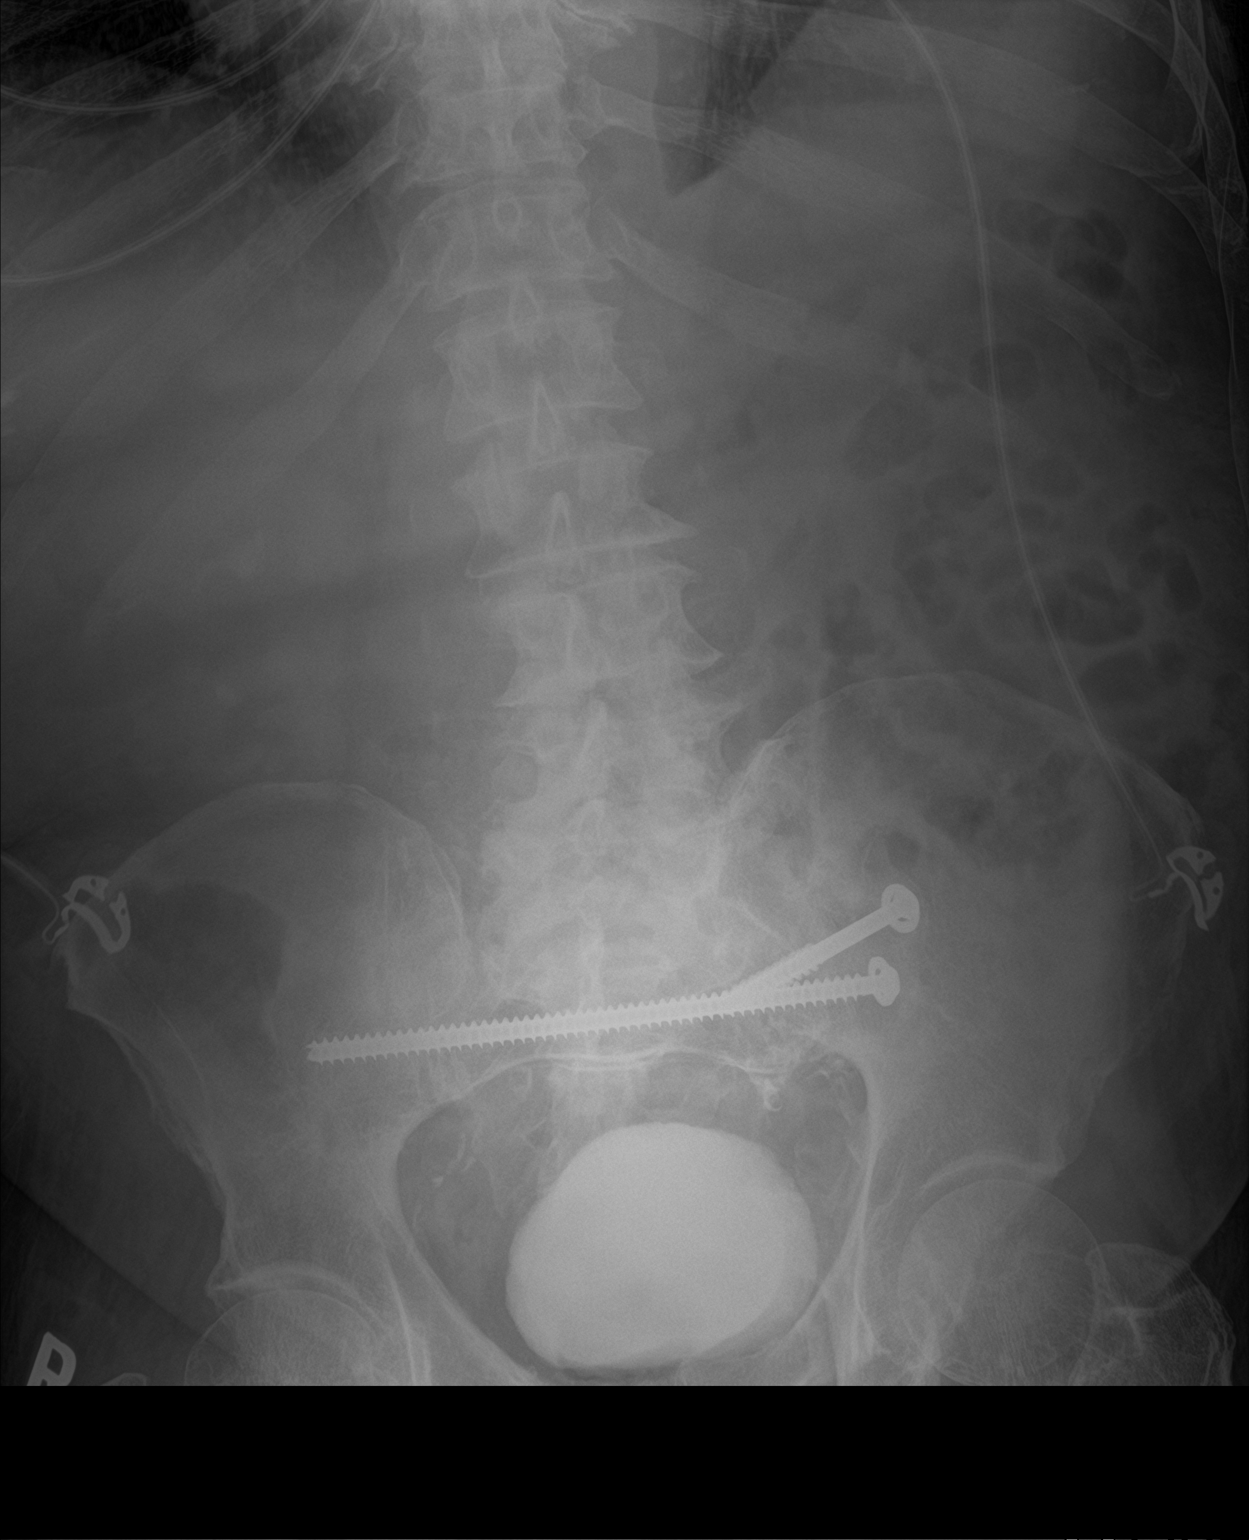

[1 of 1 positions shown; findings below may reference images not displayed]

FINDINGS: A transverse fixation screws through the bilateral SI joints and
sacrum as well as additional left SI joint fixation screw. No other
metallic or radiopaque foreign object identified. There is no bowel
dilatation or evidence of obstruction. Excreted contrast noted in
the renal collecting system and within the bladder. There is
degenerative changes of the spine. Partially visualized left pleural
effusion.
IMPRESSION: Two fixation screws in the sacrum and SI joints. No additional
radiopaque foreign object.

## 2019-12-04 IMAGING — DX DG CHEST 1V
1 series · 1 of 1 positions shown · non-contrast
Comparison: None.

CLINICAL DATA: Rapid breathing

EXAM:
CHEST  1 VIEW

[chest]
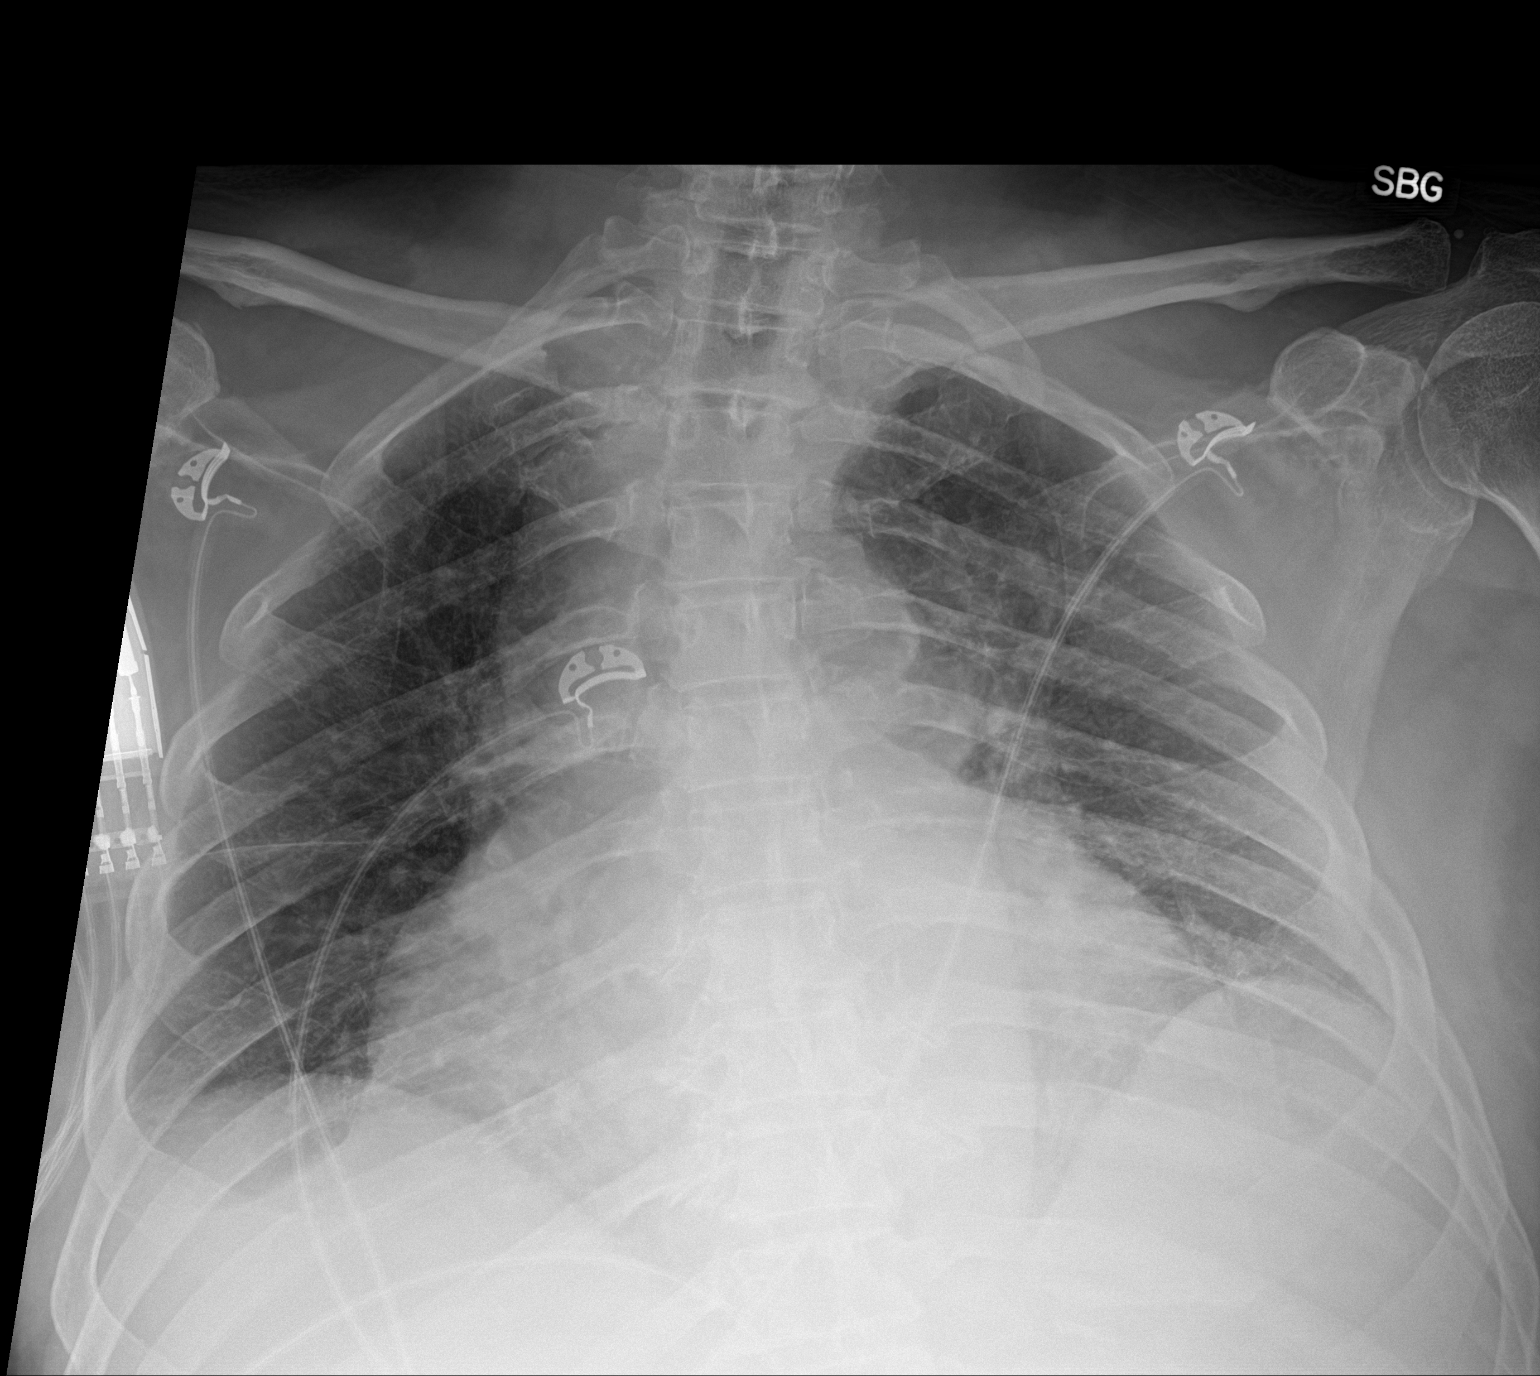

[1 of 1 positions shown; findings below may reference images not displayed]

FINDINGS: Stable enlarged cardiac silhouette. No effusion, infiltrate
pneumothorax. No acute osseous abnormality.
IMPRESSION: Cardiomegaly without acute findings.
